# Patient Record
Sex: Female | Born: 1990 | Hispanic: Yes | Marital: Single | Smoking: Former smoker
Health system: Southern US, Community
[De-identification: ages and names within clinical notes are randomized; demographics above are authoritative.]

## PROBLEM LIST (undated history)

## (undated) DIAGNOSIS — R87629 Unspecified abnormal cytological findings in specimens from vagina: Secondary | ICD-10-CM

## (undated) DIAGNOSIS — K029 Dental caries, unspecified: Secondary | ICD-10-CM

## (undated) DIAGNOSIS — F329 Major depressive disorder, single episode, unspecified: Secondary | ICD-10-CM

## (undated) DIAGNOSIS — Z9289 Personal history of other medical treatment: Secondary | ICD-10-CM

## (undated) DIAGNOSIS — R7611 Nonspecific reaction to tuberculin skin test without active tuberculosis: Secondary | ICD-10-CM

## (undated) DIAGNOSIS — O24419 Gestational diabetes mellitus in pregnancy, unspecified control: Secondary | ICD-10-CM

## (undated) DIAGNOSIS — D649 Anemia, unspecified: Secondary | ICD-10-CM

## (undated) DIAGNOSIS — E039 Hypothyroidism, unspecified: Secondary | ICD-10-CM

## (undated) HISTORY — DX: Personal history of other medical treatment: Z92.89

## (undated) HISTORY — DX: Anemia, unspecified: D64.9

## (undated) HISTORY — DX: Dental caries, unspecified: K02.9

## (undated) HISTORY — DX: Unspecified abnormal cytological findings in specimens from vagina: R87.629

## (undated) HISTORY — DX: Hypothyroidism, unspecified: E03.9

---
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
## 2014-01-15 ENCOUNTER — Emergency Department: Payer: Self-pay | Admitting: Emergency Medicine

## 2014-01-15 LAB — URINALYSIS, COMPLETE
Bilirubin,UR: NEGATIVE
Blood: NEGATIVE
Glucose,UR: NEGATIVE mg/dL (ref 0–75)
Leukocyte Esterase: NEGATIVE
Nitrite: NEGATIVE
PH: 6 (ref 4.5–8.0)
Protein: NEGATIVE
RBC,UR: 1 /HPF (ref 0–5)
SPECIFIC GRAVITY: 1.005 (ref 1.003–1.030)
Squamous Epithelial: 1
WBC UR: <1 /HPF (ref 0–5)

## 2014-01-17 ENCOUNTER — Emergency Department: Payer: Self-pay | Admitting: Emergency Medicine

## 2014-01-17 LAB — CBC WITH DIFFERENTIAL/PLATELET
BASOS ABS: 0 10*3/uL (ref 0.0–0.1)
Basophil %: 0.4 %
EOS PCT: 0.1 %
Eosinophil #: 0 10*3/uL (ref 0.0–0.7)
HCT: 34.2 % — ABNORMAL LOW (ref 35.0–47.0)
HGB: 10.8 g/dL — ABNORMAL LOW (ref 12.0–16.0)
Lymphocyte #: 1.3 10*3/uL (ref 1.0–3.6)
Lymphocyte %: 11.1 %
MCH: 23.1 pg — AB (ref 26.0–34.0)
MCHC: 31.6 g/dL — ABNORMAL LOW (ref 32.0–36.0)
MCV: 73 fL — AB (ref 80–100)
Monocyte #: 0.5 x10 3/mm (ref 0.2–0.9)
Monocyte %: 4.3 %
NEUTROS PCT: 84.1 %
Neutrophil #: 9.7 10*3/uL — ABNORMAL HIGH (ref 1.4–6.5)
Platelet: 298 10*3/uL (ref 150–440)
RBC: 4.68 10*6/uL (ref 3.80–5.20)
RDW: 18.6 % — ABNORMAL HIGH (ref 11.5–14.5)
WBC: 11.5 10*3/uL — AB (ref 3.6–11.0)

## 2014-01-17 LAB — COMPREHENSIVE METABOLIC PANEL
ALBUMIN: 3.7 g/dL (ref 3.4–5.0)
ALT: 18 U/L
AST: 15 U/L (ref 15–37)
Alkaline Phosphatase: 72 U/L
Anion Gap: 11 (ref 7–16)
BILIRUBIN TOTAL: 0.3 mg/dL (ref 0.2–1.0)
BUN: 7 mg/dL (ref 7–18)
Calcium, Total: 8.2 mg/dL — ABNORMAL LOW (ref 8.5–10.1)
Chloride: 103 mmol/L (ref 98–107)
Co2: 21 mmol/L (ref 21–32)
Creatinine: 0.62 mg/dL (ref 0.60–1.30)
EGFR (African American): >60
Glucose: 92 mg/dL (ref 65–99)
Osmolality: 268 (ref 275–301)
Potassium: 3.2 mmol/L — ABNORMAL LOW (ref 3.5–5.1)
SODIUM: 135 mmol/L — AB (ref 136–145)
TOTAL PROTEIN: 8.1 g/dL (ref 6.4–8.2)

## 2014-01-17 LAB — LIPASE, BLOOD: LIPASE: 121 U/L (ref 73–393)

## 2014-01-17 LAB — URINALYSIS, COMPLETE
BILIRUBIN, UR: NEGATIVE
Glucose,UR: NEGATIVE mg/dL (ref 0–75)
NITRITE: NEGATIVE
PH: 5 (ref 4.5–8.0)
RBC,UR: 3 /HPF (ref 0–5)
SPECIFIC GRAVITY: 1.024 (ref 1.003–1.030)
Squamous Epithelial: 12
WBC UR: 19 /HPF (ref 0–5)

## 2014-01-17 LAB — HCG, QUANTITATIVE, PREGNANCY: Beta Hcg, Quant.: 43708 m[IU]/mL — ABNORMAL HIGH

## 2014-01-17 LAB — PREGNANCY, URINE: Pregnancy Test, Urine: POSITIVE m[IU]/mL

## 2014-02-12 LAB — OB RESULTS CONSOLE GC/CHLAMYDIA
Chlamydia: NEGATIVE
GC PROBE AMP, GENITAL: NEGATIVE

## 2014-02-12 LAB — OB RESULTS CONSOLE RUBELLA ANTIBODY, IGM
RUBELLA: IMMUNE
Rubella: IMMUNE

## 2014-02-12 LAB — OB RESULTS CONSOLE HEPATITIS B SURFACE ANTIGEN: Hepatitis B Surface Ag: NEGATIVE

## 2014-02-12 LAB — OB RESULTS CONSOLE HGB/HCT, BLOOD
HCT: 15 %
Hemoglobin: 10.6 g/dL

## 2014-02-12 LAB — OB RESULTS CONSOLE VARICELLA ZOSTER ANTIBODY, IGG: VARICELLA IGG: NON-IMMUNE/NOT IMMUNE

## 2014-02-12 LAB — OB RESULTS CONSOLE HIV ANTIBODY (ROUTINE TESTING): HIV: NONREACTIVE

## 2014-02-12 LAB — SICKLE CELL SCREEN: Sickle Cell Screen: NEGATIVE

## 2014-02-12 LAB — OB RESULTS CONSOLE RPR: RPR: NONREACTIVE

## 2014-04-16 ENCOUNTER — Encounter: Payer: Self-pay | Admitting: Maternal & Fetal Medicine

## 2014-06-15 LAB — HM HIV SCREENING LAB: HM HIV Screening: NEGATIVE

## 2014-07-20 ENCOUNTER — Ambulatory Visit
Admit: 2014-07-20 | Disposition: A | Discharge status: Home / Self Care | Payer: Self-pay | Attending: Advanced Practice Midwife | Admitting: Advanced Practice Midwife

## 2014-08-07 ENCOUNTER — Ambulatory Visit
Admit: 2014-08-07 | Disposition: A | Discharge status: Home / Self Care | Payer: Self-pay | Attending: Advanced Practice Midwife | Admitting: Advanced Practice Midwife

## 2014-08-10 LAB — OB RESULTS CONSOLE GC/CHLAMYDIA
CHLAMYDIA, DNA PROBE: NEGATIVE
Gonorrhea: NEGATIVE

## 2014-08-14 LAB — OB RESULTS CONSOLE GBS: GBS: POSITIVE

## 2014-09-04 ENCOUNTER — Observation Stay
Admit: 2014-09-04 | Disposition: A | Discharge status: Home / Self Care | Payer: Self-pay | Attending: Obstetrics and Gynecology | Admitting: Obstetrics and Gynecology

## 2014-09-08 ENCOUNTER — Inpatient Hospital Stay
Admission: EM | Admit: 2014-09-08 | Discharge: 2014-09-08 | Disposition: A | Discharge status: Home / Self Care | Payer: Self-pay | Attending: Obstetrics and Gynecology | Admitting: Obstetrics and Gynecology

## 2014-09-08 ENCOUNTER — Ambulatory Visit
Admission: RE | Admit: 2014-09-08 | Discharge: 2014-09-08 | Disposition: A | Discharge status: Home / Self Care | Payer: Self-pay | Attending: Obstetrics and Gynecology | Admitting: Obstetrics and Gynecology

## 2014-09-08 ENCOUNTER — Encounter: Payer: Self-pay | Admitting: *Deleted

## 2014-09-08 DIAGNOSIS — Z3689 Encounter for other specified antenatal screening: Secondary | ICD-10-CM

## 2014-09-08 DIAGNOSIS — O24419 Gestational diabetes mellitus in pregnancy, unspecified control: Secondary | ICD-10-CM | POA: Insufficient documentation

## 2014-09-08 DIAGNOSIS — Z3A39 39 weeks gestation of pregnancy: Secondary | ICD-10-CM | POA: Insufficient documentation

## 2014-09-08 DIAGNOSIS — Z3A00 Weeks of gestation of pregnancy not specified: Secondary | ICD-10-CM | POA: Insufficient documentation

## 2014-09-08 LAB — POCT NITRAZINE TEST: POCT NITRAZINE (AMNIOSURE): NEGATIVE

## 2014-09-08 NOTE — Discharge Instructions (Signed)
Keep appointment for induction tomorrow at 8:00 PM. Come through ER at [facility]. Come sooner if needed.

## 2014-09-09 ENCOUNTER — Inpatient Hospital Stay
Admission: RE | Admit: 2014-09-09 | Discharge: 2014-09-14 | DRG: 766 | Disposition: A | Discharge status: Home / Self Care | Payer: Medicaid Other | Attending: Obstetrics and Gynecology | Admitting: Obstetrics and Gynecology

## 2014-09-09 DIAGNOSIS — O24429 Gestational diabetes mellitus in childbirth, unspecified control: Secondary | ICD-10-CM | POA: Diagnosis present

## 2014-09-09 DIAGNOSIS — Y763 Surgical instruments, materials and obstetric and gynecological devices (including sutures) associated with adverse incidents: Secondary | ICD-10-CM

## 2014-09-09 DIAGNOSIS — Z915 Personal history of self-harm: Secondary | ICD-10-CM | POA: Diagnosis present

## 2014-09-09 DIAGNOSIS — O24419 Gestational diabetes mellitus in pregnancy, unspecified control: Secondary | ICD-10-CM

## 2014-09-09 DIAGNOSIS — Z79899 Other long term (current) drug therapy: Secondary | ICD-10-CM | POA: Diagnosis not present

## 2014-09-09 DIAGNOSIS — Z8632 Personal history of gestational diabetes: Secondary | ICD-10-CM

## 2014-09-09 DIAGNOSIS — Z98891 History of uterine scar from previous surgery: Secondary | ICD-10-CM

## 2014-09-09 DIAGNOSIS — Z3A40 40 weeks gestation of pregnancy: Secondary | ICD-10-CM | POA: Diagnosis present

## 2014-09-09 DIAGNOSIS — IMO0001 Reserved for inherently not codable concepts without codable children: Secondary | ICD-10-CM

## 2014-09-09 HISTORY — DX: Nonspecific reaction to tuberculin skin test without active tuberculosis: R76.11

## 2014-09-09 HISTORY — DX: Major depressive disorder, single episode, unspecified: F32.9

## 2014-09-09 HISTORY — DX: Gestational diabetes mellitus in pregnancy, unspecified control: O24.419

## 2014-09-09 LAB — CBC
HCT: 34 % — ABNORMAL LOW (ref 35.0–47.0)
Hemoglobin: 10.8 g/dL — ABNORMAL LOW (ref 12.0–16.0)
MCH: 24.4 pg — ABNORMAL LOW (ref 26.0–34.0)
MCHC: 31.9 g/dL — AB (ref 32.0–36.0)
MCV: 76.6 fL — ABNORMAL LOW (ref 80.0–100.0)
Platelets: 251 10*3/uL (ref 150–440)
RBC: 4.43 MIL/uL (ref 3.80–5.20)
RDW: 17.2 % — ABNORMAL HIGH (ref 11.5–14.5)
WBC: 9.9 10*3/uL (ref 3.6–11.0)

## 2014-09-09 LAB — GLUCOSE, CAPILLARY: Glucose-Capillary: 77 mg/dL (ref 70–99)

## 2014-09-09 LAB — TYPE AND SCREEN
ABO/RH(D): O POS
Antibody Screen: NEGATIVE

## 2014-09-09 MED ORDER — CITRIC ACID-SODIUM CITRATE 334-500 MG/5ML PO SOLN: 30.0000 mL | ORAL | Status: DC | PRN

## 2014-09-09 MED ORDER — GLYBURIDE 2.5 MG PO TABS
2.5000 mg | ORAL_TABLET | Freq: Two times a day (BID) | ORAL | Status: DC
  Filled 2014-09-09 (×3): qty 1

## 2014-09-09 MED ORDER — OXYTOCIN 40 UNITS IN LACTATED RINGERS INFUSION - SIMPLE MED
INTRAVENOUS | Status: AC
  Filled 2014-09-09: qty 1000

## 2014-09-09 MED ORDER — OXYTOCIN BOLUS FROM INFUSION
500.0000 mL | INTRAVENOUS | Status: DC
Start: 2014-09-09 — End: 2014-09-10

## 2014-09-09 MED ORDER — AMMONIA AROMATIC IN INHA
RESPIRATORY_TRACT | Status: AC
  Filled 2014-09-09: qty 10

## 2014-09-09 MED ORDER — PENICILLIN G POTASSIUM 5000000 UNITS IJ SOLR
5.0000 10*6.[IU] | Freq: Once | INTRAVENOUS | Status: AC
  Administered 2014-09-09: 5 10*6.[IU] via INTRAVENOUS
  Filled 2014-09-09: qty 5

## 2014-09-09 MED ORDER — LACTATED RINGERS IV SOLN
500.0000 mL | INTRAVENOUS | Status: DC | PRN
Start: 2014-09-09 — End: 2014-09-10
  Administered 2014-09-10: 14:00:00 via INTRAVENOUS
  Administered 2014-09-10 (×2): 500 mL via INTRAVENOUS

## 2014-09-09 MED ORDER — PENICILLIN G POTASSIUM 5000000 UNITS IJ SOLR
2.5000 10*6.[IU] | INTRAVENOUS | Status: DC
  Administered 2014-09-10 (×3): 2.5 10*6.[IU] via INTRAVENOUS
  Filled 2014-09-09 (×11): qty 2.5

## 2014-09-09 MED ORDER — INSULIN ASPART 100 UNIT/ML ~~LOC~~ SOLN
0.0000 [IU] | SUBCUTANEOUS | Status: DC
  Filled 2014-09-09: qty 0.09

## 2014-09-09 MED ORDER — MISOPROSTOL 200 MCG PO TABS
ORAL_TABLET | ORAL | Status: AC
  Filled 2014-09-09: qty 4

## 2014-09-09 MED ORDER — TERBUTALINE SULFATE 1 MG/ML IJ SOLN: 0.2500 mg | Freq: Once | INTRAMUSCULAR | Status: AC | PRN

## 2014-09-09 MED ORDER — OXYCODONE-ACETAMINOPHEN 5-325 MG PO TABS: 2.0000 | ORAL_TABLET | ORAL | Status: DC | PRN

## 2014-09-09 MED ORDER — LIDOCAINE HCL (PF) 1 % IJ SOLN
INTRAMUSCULAR | Status: AC
  Filled 2014-09-09: qty 30

## 2014-09-09 MED ORDER — OXYCODONE-ACETAMINOPHEN 5-325 MG PO TABS: 1.0000 | ORAL_TABLET | ORAL | Status: DC | PRN

## 2014-09-09 MED ORDER — BUTORPHANOL TARTRATE 1 MG/ML IJ SOLN
1.0000 mg | INTRAMUSCULAR | Status: DC | PRN
  Administered 2014-09-10 (×4): 1 mg via INTRAVENOUS

## 2014-09-09 MED ORDER — OXYTOCIN 40 UNITS IN LACTATED RINGERS INFUSION - SIMPLE MED: 62.5000 mL/h | INTRAVENOUS | Status: DC

## 2014-09-09 MED ORDER — ACETAMINOPHEN 325 MG PO TABS: 650.0000 mg | ORAL_TABLET | ORAL | Status: DC | PRN

## 2014-09-09 MED ORDER — LACTATED RINGERS IV SOLN
INTRAVENOUS | Status: DC
  Administered 2014-09-09 – 2014-09-10 (×2): via INTRAVENOUS

## 2014-09-09 MED ORDER — ONDANSETRON HCL 4 MG/2ML IJ SOLN
4.0000 mg | Freq: Four times a day (QID) | INTRAMUSCULAR | Status: DC | PRN
  Administered 2014-09-10: 4 mg via INTRAVENOUS

## 2014-09-09 MED ORDER — DINOPROSTONE 10 MG VA INST
10.0000 mg | VAGINAL_INSERT | Freq: Once | VAGINAL | Status: AC
  Administered 2014-09-09: 10 mg via VAGINAL
  Filled 2014-09-09: qty 1

## 2014-09-09 MED ORDER — LIDOCAINE HCL (PF) 1 % IJ SOLN: 30.0000 mL | INTRAMUSCULAR | Status: DC | PRN

## 2014-09-09 MED ORDER — ZOLPIDEM TARTRATE 5 MG PO TABS: 5.0000 mg | ORAL_TABLET | Freq: Every evening | ORAL | Status: DC | PRN

## 2014-09-09 NOTE — OB Triage Note (Signed)
AVS discharge instructions and labor/pregnancy precautions given to patient with no questions or concerns.   Quest Tavenner F  

## 2014-09-09 NOTE — H&P (Signed)
HISTORY AND PHYSICAL  HISTORY OF PRESENT ILLNESS: Ms. [name] Kidd is a 24 y.o. G1P0 at 7055w6d by 6 week ultrasound with a pregnancy complicated by A2GDM presenting for induction of labor. She has been managed with small doses of glyburide starting at 1.25 mg QHS increasing to 2.5mg  BID in April. She reports fasting blood sugars in the 90s and typical postprandials <120 with the highest recorded value being 137.   She has not been having contractions and denies leakage of fluid, vaginal bleeding, or decreased fetal movement.    REVIEW OF SYSTEMS: A complete review of systems was performed and was specifically negative for headache, changes in vision, RUQ pain, shortness of breath, chest pain, lower extremity edema and dysuria.   HISTORY:  Past Medical History  Diagnosis Date  . Gestational diabetes   . PPD positive, treated 2010    Negative by CXR  . Depression 2006    History of suicide attempt    History reviewed. No pertinent past surgical history.  No current facility-administered medications on file prior to encounter.   Current Outpatient Prescriptions on File Prior to Encounter  Medication Sig Dispense Refill  . Prenatal MV-Min-Fe Fum-FA-DHA (PRENATAL 1 PO) Take 1 tablet by mouth daily.       Allergies  Allergen Reactions  . Condoms - Female [Nonoxynol 9]     OB History  Gravida Para Term Preterm AB SAB TAB Ectopic Multiple Living  1             # Outcome Date GA Lbr Len/2nd Weight Sex Delivery Anes PTL Lv  1 Current               Gynecologic History: History of Abnormal Pap Smear: n/a History of STI: none this pregnancy  History  Substance Use Topics  . Smoking status: Never Smoker   . Smokeless tobacco: Never Used  . Alcohol Use: No    PHYSICAL EXAM: Temp:  [98.1 F (36.7 C)-98.2 F (36.8 C)] 98.1 F (36.7 C) (05/04 2014) Pulse Rate:  [100-102] 100 (05/04 2014) Resp:  [18] 18 (05/03 2200) BP: (120-132)/(77-82) 120/77 mmHg (05/04 2014) Weight:   [86.183 kg (190 lb)] 86.183 kg (190 lb) (05/04 2115)  GENERAL: NAD AAOx3 CHEST:CTAB no increased work of breathing CV:RRR no appreciable murmurs, rubs, gallops ABDOMEN: gravid, nontender, EFW 3800g by Leopolds EXTREMITIES:  Warm and well-perfused, nontender, nonedematous, 2+ DTRs no clonus CERVIX: 1/ 50/ -4 posterior SPECULUM: n/a  FHT:140s baseline with mod variability + accelerations and no decelerations  Toco: quiet  DIAGNOSTIC STUDIES: No results for input(s): WBC, HGB, HCT, PLT, NA, K, CL, CO2, BUN, CREATININE, LABGLOM, GLUCOSE, CALCIUM, BILIDIR, ALKPHOS, AST, ALT, PROT, MG in the last 168 hours.  Invalid input(s): LABALB, UA  PRENATAL STUDIES:  Prenatal Labs:  MBT: O+; Rubella immune, Varicella non- immune, HIV neg, RPR neg, Hep B neg, GC/CT neg, GBS +, glucola 193 3 hr 81/203/162/135  Last US December  ASSESSMENT AND PLAN:  1. Fetal Well being  - Fetal Tracing: reassuing - Group B Streptococcus: +, no PCN allergies.  - Presentation: vtx confirmed by Leopolds    2. Routine OB: - Prenatal labs reviewed, as above - Rh +  3. Induction of Labor:  -  Contractions absent external toco in place -  Pelvis unproven -  Plan for induction with Cervidil  4. Post Partum Planning: - Infant feeding: breast - Contraception: needs to be discussed

## 2014-09-09 NOTE — Progress Notes (Signed)
Spanish Interpreter at Bedside

## 2014-09-10 ENCOUNTER — Inpatient Hospital Stay: Payer: Medicaid Other | Admitting: Anesthesiology

## 2014-09-10 ENCOUNTER — Encounter
Admission: RE | Disposition: A | Discharge status: Home / Self Care | Payer: Self-pay | Source: Home / Self Care | Attending: Obstetrics and Gynecology

## 2014-09-10 ENCOUNTER — Inpatient Hospital Stay: Payer: Medicaid Other

## 2014-09-10 LAB — GLUCOSE, CAPILLARY
GLUCOSE-CAPILLARY: 111 mg/dL — AB (ref 70–99)
GLUCOSE-CAPILLARY: 128 mg/dL — AB (ref 70–99)
Glucose-Capillary: 126 mg/dL — ABNORMAL HIGH (ref 70–99)

## 2014-09-10 LAB — CHLAMYDIA/NGC RT PCR (ARMC ONLY)
CHLAMYDIA TR: NOT DETECTED
N gonorrhoeae: NOT DETECTED

## 2014-09-10 SURGERY — Surgical Case
Anesthesia: Epidural

## 2014-09-10 SURGERY — Surgical Case
Anesthesia: Epidural | Wound class: Clean Contaminated

## 2014-09-10 MED ORDER — CITRIC ACID-SODIUM CITRATE 334-500 MG/5ML PO SOLN
ORAL | Status: AC
  Filled 2014-09-10: qty 15

## 2014-09-10 MED ORDER — OXYTOCIN 40 UNITS IN LACTATED RINGERS INFUSION - SIMPLE MED: 62.5000 mL/h | INTRAVENOUS | Status: DC

## 2014-09-10 MED ORDER — CARBOPROST TROMETHAMINE 250 MCG/ML IM SOLN
INTRAMUSCULAR | Status: AC
  Administered 2014-09-10: 250 ug via INTRAMUSCULAR
  Filled 2014-09-10: qty 1

## 2014-09-10 MED ORDER — METHYLERGONOVINE MALEATE 0.2 MG/ML IJ SOLN
INTRAMUSCULAR | Status: DC | PRN
  Administered 2014-09-10: .2 mg via INTRAMUSCULAR

## 2014-09-10 MED ORDER — MENTHOL 3 MG MT LOZG
1.0000 | LOZENGE | OROMUCOSAL | Status: DC | PRN
  Filled 2014-09-10: qty 9

## 2014-09-10 MED ORDER — OXYCODONE-ACETAMINOPHEN 5-325 MG PO TABS
1.0000 | ORAL_TABLET | ORAL | Status: DC | PRN
  Administered 2014-09-10 – 2014-09-14 (×5): 1 via ORAL
  Filled 2014-09-10 (×4): qty 1

## 2014-09-10 MED ORDER — ONDANSETRON HCL 4 MG/2ML IJ SOLN: 4.0000 mg | Freq: Once | INTRAMUSCULAR | Status: DC | PRN

## 2014-09-10 MED ORDER — ACETAMINOPHEN 325 MG PO TABS: 650.0000 mg | ORAL_TABLET | ORAL | Status: DC | PRN

## 2014-09-10 MED ORDER — PROPOFOL 10 MG/ML IV BOLUS
INTRAVENOUS | Status: DC | PRN
  Administered 2014-09-10: 200 mg via INTRAVENOUS

## 2014-09-10 MED ORDER — HYDROMORPHONE HCL 1 MG/ML IJ SOLN
INTRAMUSCULAR | Status: AC
  Filled 2014-09-10: qty 1

## 2014-09-10 MED ORDER — SIMETHICONE 80 MG PO CHEW
80.0000 mg | CHEWABLE_TABLET | ORAL | Status: DC
  Administered 2014-09-11: 80 mg via ORAL
  Filled 2014-09-10: qty 1

## 2014-09-10 MED ORDER — LANOLIN HYDROUS EX OINT: 1.0000 "application " | TOPICAL_OINTMENT | CUTANEOUS | Status: DC | PRN

## 2014-09-10 MED ORDER — SIMETHICONE 80 MG PO CHEW
80.0000 mg | CHEWABLE_TABLET | ORAL | Status: DC | PRN
  Administered 2014-09-12: 80 mg via ORAL
  Filled 2014-09-10: qty 1

## 2014-09-10 MED ORDER — PRENATAL MULTIVITAMIN CH
1.0000 | ORAL_TABLET | Freq: Every day | ORAL | Status: DC
  Administered 2014-09-11 – 2014-09-14 (×4): 1 via ORAL
  Filled 2014-09-10 (×4): qty 1

## 2014-09-10 MED ORDER — HYDROMORPHONE HCL 1 MG/ML IJ SOLN
0.2500 mg | INTRAMUSCULAR | Status: DC | PRN
  Administered 2014-09-10 (×5): 0.5 mg via INTRAVENOUS

## 2014-09-10 MED ORDER — TERBUTALINE SULFATE 1 MG/ML IJ SOLN
INTRAMUSCULAR | Status: AC
  Filled 2014-09-10: qty 1

## 2014-09-10 MED ORDER — PHENYLEPHRINE HCL 10 MG/ML IJ SOLN
INTRAMUSCULAR | Status: DC | PRN
  Administered 2014-09-10 (×2): 50 ug via INTRAVENOUS

## 2014-09-10 MED ORDER — SIMETHICONE 80 MG PO CHEW
80.0000 mg | CHEWABLE_TABLET | Freq: Three times a day (TID) | ORAL | Status: DC
  Administered 2014-09-12 – 2014-09-14 (×5): 80 mg via ORAL
  Filled 2014-09-10 (×5): qty 1

## 2014-09-10 MED ORDER — TERBUTALINE SULFATE 1 MG/ML IJ SOLN
0.2500 mg | Freq: Once | INTRAMUSCULAR | Status: AC | PRN
  Administered 2014-09-10: 0.25 mg via SUBCUTANEOUS

## 2014-09-10 MED ORDER — LACTATED RINGERS IV SOLN: INTRAVENOUS | Status: DC

## 2014-09-10 MED ORDER — SUCCINYLCHOLINE CHLORIDE 20 MG/ML IJ SOLN
INTRAMUSCULAR | Status: DC | PRN
  Administered 2014-09-10: 140 mg via INTRAVENOUS

## 2014-09-10 MED ORDER — FENTANYL CITRATE (PF) 100 MCG/2ML IJ SOLN
INTRAMUSCULAR | Status: DC | PRN
  Administered 2014-09-10 (×3): 50 ug via INTRAVENOUS
  Administered 2014-09-10: 100 ug via INTRAVENOUS

## 2014-09-10 MED ORDER — FENTANYL 2.5 MCG/ML W/ROPIVACAINE 0.2% IN NS 100 ML EPIDURAL INFUSION (ARMC-ANES)
9.0000 mL/h | EPIDURAL | Status: DC
  Administered 2014-09-10 (×2): 9 mL/h via EPIDURAL

## 2014-09-10 MED ORDER — TETANUS-DIPHTH-ACELL PERTUSSIS 5-2.5-18.5 LF-MCG/0.5 IM SUSP: 0.5000 mL | Freq: Once | INTRAMUSCULAR | Status: DC

## 2014-09-10 MED ORDER — MIDAZOLAM HCL 2 MG/2ML IJ SOLN
INTRAMUSCULAR | Status: DC | PRN
  Administered 2014-09-10: 2 mg via INTRAVENOUS

## 2014-09-10 MED ORDER — FENTANYL 2.5 MCG/ML W/ROPIVACAINE 0.2% IN NS 100 ML EPIDURAL INFUSION (ARMC-ANES)
EPIDURAL | Status: AC
  Filled 2014-09-10: qty 100

## 2014-09-10 MED ORDER — ZOLPIDEM TARTRATE 5 MG PO TABS: 5.0000 mg | ORAL_TABLET | Freq: Every evening | ORAL | Status: DC | PRN

## 2014-09-10 MED ORDER — EPHEDRINE 5 MG/ML INJ
10.0000 mg | INTRAVENOUS | Status: DC | PRN
  Filled 2014-09-10: qty 2

## 2014-09-10 MED ORDER — LIDOCAINE-EPINEPHRINE (PF) 1.5 %-1:200000 IJ SOLN
INTRAMUSCULAR | Status: DC | PRN
  Administered 2014-09-10: 3 mL

## 2014-09-10 MED ORDER — DIPHENHYDRAMINE HCL 50 MG/ML IJ SOLN: 12.5000 mg | INTRAMUSCULAR | Status: DC | PRN

## 2014-09-10 MED ORDER — IBUPROFEN 600 MG PO TABS
600.0000 mg | ORAL_TABLET | Freq: Four times a day (QID) | ORAL | Status: DC
  Administered 2014-09-11 – 2014-09-14 (×12): 600 mg via ORAL
  Filled 2014-09-10 (×12): qty 1

## 2014-09-10 MED ORDER — EPHEDRINE SULFATE 50 MG/ML IJ SOLN
5.0000 mg | Freq: Once | INTRAMUSCULAR | Status: AC
  Administered 2014-09-10: 5 mg via INTRAVENOUS

## 2014-09-10 MED ORDER — BUTORPHANOL TARTRATE 1 MG/ML IJ SOLN
INTRAMUSCULAR | Status: AC
  Filled 2014-09-10: qty 1

## 2014-09-10 MED ORDER — BUPIVACAINE HCL (PF) 0.25 % IJ SOLN
INTRAMUSCULAR | Status: DC | PRN
  Administered 2014-09-10: 8 mL

## 2014-09-10 MED ORDER — CEFAZOLIN SODIUM-DEXTROSE 2-3 GM-% IV SOLR
INTRAVENOUS | Status: AC
Start: 2014-09-10 — End: 2014-09-10
  Administered 2014-09-10: 2 g via INTRAVENOUS
  Filled 2014-09-10: qty 50

## 2014-09-10 MED ORDER — DIPHENHYDRAMINE HCL 25 MG PO CAPS: 25.0000 mg | ORAL_CAPSULE | Freq: Four times a day (QID) | ORAL | Status: DC | PRN

## 2014-09-10 MED ORDER — BUTORPHANOL TARTRATE 1 MG/ML IJ SOLN
INTRAMUSCULAR | Status: AC
  Administered 2014-09-10: 1 mg via INTRAVENOUS
  Filled 2014-09-10: qty 1

## 2014-09-10 MED ORDER — EPHEDRINE SULFATE 50 MG/ML IJ SOLN
INTRAMUSCULAR | Status: AC
  Filled 2014-09-10: qty 1

## 2014-09-10 MED ORDER — SENNOSIDES-DOCUSATE SODIUM 8.6-50 MG PO TABS
2.0000 | ORAL_TABLET | ORAL | Status: DC
  Administered 2014-09-11 – 2014-09-12 (×2): 2 via ORAL
  Filled 2014-09-10 (×2): qty 2

## 2014-09-10 MED ORDER — SODIUM CHLORIDE 0.9 % IJ SOLN
INTRAMUSCULAR | Status: AC
  Filled 2014-09-10: qty 10

## 2014-09-10 MED ORDER — WITCH HAZEL-GLYCERIN EX PADS: 1.0000 "application " | MEDICATED_PAD | CUTANEOUS | Status: DC | PRN

## 2014-09-10 MED ORDER — PHENYLEPHRINE 40 MCG/ML (10ML) SYRINGE FOR IV PUSH (FOR BLOOD PRESSURE SUPPORT)
80.0000 ug | PREFILLED_SYRINGE | INTRAVENOUS | Status: DC | PRN
  Filled 2014-09-10: qty 2

## 2014-09-10 MED ORDER — DIBUCAINE 1 % RE OINT
1.0000 | TOPICAL_OINTMENT | RECTAL | Status: DC | PRN
Start: 2014-09-10 — End: 2014-09-15
  Filled 2014-09-10: qty 28

## 2014-09-10 MED ORDER — OXYTOCIN 40 UNITS IN LACTATED RINGERS INFUSION - SIMPLE MED
1.0000 m[IU]/min | INTRAVENOUS | Status: DC
  Administered 2014-09-10 (×2): 40 mL via INTRAVENOUS

## 2014-09-10 MED ORDER — OXYCODONE-ACETAMINOPHEN 5-325 MG PO TABS
2.0000 | ORAL_TABLET | ORAL | Status: DC | PRN
  Administered 2014-09-11 – 2014-09-13 (×10): 2 via ORAL
  Filled 2014-09-10 (×10): qty 2

## 2014-09-10 SURGICAL SUPPLY — 23 items
BARRIER ADHS 3X4 INTERCEED (GAUZE/BANDAGES/DRESSINGS) ×6 IMPLANT
BNDG TENSOPLAST 6X5 (GAUZE/BANDAGES/DRESSINGS) ×3 IMPLANT
CANISTER SUCT 3000ML (MISCELLANEOUS) ×3 IMPLANT
CATH KIT ON-Q SILVERSOAK 5IN (CATHETERS) ×6 IMPLANT
CHLORAPREP W/TINT 26ML (MISCELLANEOUS) ×3 IMPLANT
DRAPE C-SECTION (MISCELLANEOUS) ×3 IMPLANT
DRSG TELFA 3X8 NADH (GAUZE/BANDAGES/DRESSINGS) ×3 IMPLANT
ELECT CAUTERY BLADE 6.4 (BLADE) ×3 IMPLANT
GAUZE SPONGE 4X4 12PLY STRL (GAUZE/BANDAGES/DRESSINGS) ×3 IMPLANT
GLOVE BIO SURGEON STRL SZ8 (GLOVE) ×3 IMPLANT
GOWN STRL REUS W/ TWL LRG LVL3 (GOWN DISPOSABLE) ×2 IMPLANT
GOWN STRL REUS W/ TWL XL LVL3 (GOWN DISPOSABLE) ×1 IMPLANT
GOWN STRL REUS W/TWL LRG LVL3 (GOWN DISPOSABLE) ×4
GOWN STRL REUS W/TWL XL LVL3 (GOWN DISPOSABLE) ×2
NS IRRIG 1000ML POUR BTL (IV SOLUTION) ×3 IMPLANT
PACK C SECTION AR (MISCELLANEOUS) ×3 IMPLANT
PAD GROUND ADULT SPLIT (MISCELLANEOUS) ×3 IMPLANT
PAD OB MATERNITY 4.3X12.25 (PERSONAL CARE ITEMS) ×3 IMPLANT
PAD PREP 24X41 OB/GYN DISP (PERSONAL CARE ITEMS) ×3 IMPLANT
STRAP SAFETY BODY (MISCELLANEOUS) ×3 IMPLANT
SUT CHROMIC 1 CTX 36 (SUTURE) ×12 IMPLANT
SUT PLAIN GUT 0 (SUTURE) ×6 IMPLANT
SUT VIC AB 0 CT1 36 (SUTURE) ×6 IMPLANT

## 2014-09-10 SURGICAL SUPPLY — 23 items
CANISTER SUCT 3000ML (MISCELLANEOUS) IMPLANT
CATH KIT ON-Q SILVERSOAK 5IN (CATHETERS) IMPLANT
CHLORAPREP W/TINT 26ML (MISCELLANEOUS) IMPLANT
DRSG TELFA 3X8 NADH (GAUZE/BANDAGES/DRESSINGS) IMPLANT
ELECT CAUTERY BLADE 6.4 (BLADE) IMPLANT
GAUZE SPONGE 4X4 12PLY STRL (GAUZE/BANDAGES/DRESSINGS) IMPLANT
GLOVE BIO SURGEON STRL SZ7.5 (GLOVE) IMPLANT
GOWN STRL REUS W/ TWL LRG LVL3 (GOWN DISPOSABLE) IMPLANT
GOWN STRL REUS W/TWL LRG LVL3 (GOWN DISPOSABLE)
NS IRRIG 1000ML POUR BTL (IV SOLUTION) IMPLANT
PACK C SECTION AR (MISCELLANEOUS) IMPLANT
PAD GROUND ADULT SPLIT (MISCELLANEOUS) IMPLANT
PAD OB MATERNITY 4.3X12.25 (PERSONAL CARE ITEMS) IMPLANT
PAD PREP 24X41 OB/GYN DISP (PERSONAL CARE ITEMS) IMPLANT
STRAP SAFETY BODY (MISCELLANEOUS) IMPLANT
SUT MNCRL 0 1X36 CT-1 (SUTURE) IMPLANT
SUT MNCRL 4-0 (SUTURE)
SUT MNCRL 4-0 27XMFL (SUTURE)
SUT MONOCRYL 0 (SUTURE)
SUT VIC AB 0 CT1 36 (SUTURE) IMPLANT
SUT VIC AB 3-0 SH 27 (SUTURE)
SUT VIC AB 3-0 SH 27X BRD (SUTURE) IMPLANT
SUTURE MNCRL 4-0 27XMF (SUTURE) IMPLANT

## 2014-09-10 NOTE — Plan of Care (Deleted)
Cervadil found on exam. Pt. Stated Cervadil came out when up to bathroom early in the morning.

## 2014-09-10 NOTE — Anesthesia Procedure Notes (Addendum)
Epidural Patient location during procedure: OB Start time: 09/10/2014 8:35 AM End time: 09/10/2014 9:26 AM  Staffing Anesthesiologist: Forestine ChutePOLIN, CARRIE Other anesthesia staff: Junious SilkNOLES, MARK Performed by: anesthesiologist and other anesthesia staff   Preanesthetic Checklist Completed: patient identified, surgical consent, pre-op evaluation, timeout performed, IV checked, risks and benefits discussed and monitors and equipment checked  Epidural Patient position: sitting Prep: Betadine Patient monitoring: heart rate, continuous pulse ox and blood pressure Approach: midline Location: L3-L4 Injection technique: LOR saline  Needle:  Needle type: Tuohy  Needle gauge: 18 G Needle insertion depth: 7 cm Catheter at skin depth: 12 cm Test dose: negative and 1.5% lidocaine with Epi 1:200 K  Assessment Sensory level: T10 Events: blood not aspirated, injection not painful and no paresthesia  Additional Notes Junious SilkMark Noles CRNA in room made 3 attempts.  Called MDA Carrie Polin to bedside where she was successful.  No complications.  Reason for block:procedure for pain

## 2014-09-10 NOTE — Transfer of Care (Signed)
Immediate Anesthesia Transfer of Care Note  Patient: [name]  Procedure(s) Performed: * No procedures listed *  Patient Location: PACU and Women's Unit  Anesthesia Type:Epidural  Level of Consciousness: awake, alert  and oriented  Airway & Oxygen Therapy: Patient Spontanous Breathing  Post-op Assessment: Report given to RN  Post vital signs: stable  Last Vitals:  Filed Vitals:   09/10/14 0917  BP: 141/99  Pulse: 111  Temp:   Resp:     Complications: No apparent anesthesia complications

## 2014-09-10 NOTE — Transfer of Care (Signed)
Immediate Anesthesia Transfer of Care Note  Patient: [name] Kidd  Procedure(s) Performed: Procedure(s): CESAREAN SECTION  Patient Location: PACU, Mother/Baby and Women's Unit  Anesthesia Type:General  Level of Consciousness: awake, alert  and oriented  Airway & Oxygen Therapy: Patient Spontanous Breathing and Patient connected to face mask oxygen  Post-op Assessment: Report given to RN, Post -op Vital signs reviewed and stable and Patient moving all extremities X 4  Post vital signs: Reviewed and stable  Last Vitals:  Filed Vitals:   09/10/14 1218  BP: 93/55  Pulse: 124  Temp:   Resp:     Complications: No apparent anesthesia complications

## 2014-09-10 NOTE — Anesthesia Preprocedure Evaluation (Signed)
Anesthesia Evaluation  Patient identified by MRN, date of birth, ID band Patient awake    Reviewed: Allergy & Precautions, H&P , NPO status , Patient's Chart, lab work & pertinent test results  Airway Mallampati: II  TM Distance: >3 FB     Dental no notable dental hx.    Pulmonary neg pulmonary ROS,    Pulmonary exam normal       Cardiovascular negative cardio ROS Normal cardiovascular exam    Neuro/Psych negative neurological ROS  negative psych ROS   GI/Hepatic negative GI ROS, Neg liver ROS,   Endo/Other  negative endocrine ROSdiabetes  Renal/GU negative Renal ROS  negative genitourinary   Musculoskeletal   Abdominal   Peds  Hematology negative hematology ROS (+)   Anesthesia Other Findings   Reproductive/Obstetrics                             Anesthesia Physical  Anesthesia Plan  ASA: II and emergent  Anesthesia Plan: Epidural and General   Post-op Pain Management: MAC Combined w/ Regional for Post-op pain   Induction: Intravenous, Rapid sequence and Cricoid pressure planned  Airway Management Planned: Oral ETT  Additional Equipment:   Intra-op Plan:   Post-operative Plan: Extubation in OR  Informed Consent: I have reviewed the patients History and Physical, chart, labs and discussed the procedure including the risks, benefits and alternatives for the proposed anesthesia with the patient or authorized representative who has indicated his/her understanding and acceptance.     Plan Discussed with: CRNA and Surgeon  Anesthesia Plan Comments: (Pt for stat C/S, plan for general anesthesia if epidural coverage is inadequate)        Anesthesia Quick Evaluation

## 2014-09-10 NOTE — Brief Op Note (Signed)
09/09/2014 - 09/10/2014  1:59 PM  PATIENT:  [name] Kidd  24 y.o. female  PRE-OPERATIVE DIAGNOSIS:  stat csection  POST-OPERATIVE DIAGNOSIS:  stat csection  PROCEDURE:  Procedure(s): CESAREAN SECTION (N/A)  SURGEON:  Surgeon(s) and Role:    Suzy Bouchard* Teodora Baumgarten J Fidelia Cathers, MD - Primary  PHYSICIAN ASSISTANT:   ASSISTANTS: scrub tech Duwaine Maxinashley smith   ANESTHESIA:   epidural and general  EBL:  Total I/O In: 1000 [I.V.:1000] Out: 1450 [Urine:750; Blood:700]  BLOOD ADMINISTERED:none  DRAINS: none    MEDICATIONS USED:  OTHER methergine and hemabate  SPECIMEN:  Source of Specimen:  placenta  DISPOSITION OF SPECIMEN:  PATHOLOGY  COUNTS:  No count given emergent procedure , xray post op cleared  TOURNIQUET:  * No tourniquets in log *  DICTATION: .Other Dictation: Dictation Number verbal  PLAN OF CARE: Admit to inpatient   PATIENT DISPOSITION:  PACU - hemodynamically stable.   Delay start of Pharmacological VTE agent (>24hrs) due to surgical blood loss or risk of bleeding: no

## 2014-09-10 NOTE — Op Note (Signed)
NAMRadene Kidd:  [name] Kidd Kidd, [name] Kidd      ACCOUNT NO.:  [id number]  MEDICAL RECORD NO.:  [id number]  LOCATION:  LDR1                         FACILITY:  [facility]  PHYSICIAN:  Jennell Cornerhomas Holten Spano, MDDATE OF BIRTH:  06-17-1990  DATE OF PROCEDURE: DATE OF DISCHARGE:                              OPERATIVE REPORT   PREOPERATIVE DIAGNOSIS: 1. Fetal bradycardia, unresponsive to intrauterine resuscitative     maneuvers. 2. Forty +[redacted] weeks gestation.  POSTOPERATIVE DIAGNOSIS: 1. Fetal bradycardia, unresponsive to intrauterine resuscitative     maneuvers. 2. Forty +[redacted] weeks gestation.  PROCEDURE PERFORMED:  Emergent primary low transverse cesarean section.  SURGEON:  Jennell Cornerhomas Karelly Dewalt, MD  ANESTHESIA:  Continuous lumbar epidural converted to general endotracheal anesthesia.  INDICATIONS:  A 24 year old gravida 1, para 0, at 2740 +0 weeks estimated gestational age who was laboring the morning of the procedure and was progressing from 8 to complete.  Fetus demonstrated significant bradycardic episode at 1249 and again at 1300 where the fetal heart rate decelerated to 40s and remained there.  Emergency stat primary cesarean section was called at 1302.  DESCRIPTION OF PROCEDURE:  The patient was emergently brought into the operating room.  The patient was prepped and draped emergently, and Allis test was performed and the patient was tolerating the Allis test. Direct Pfannenstiel incision was made.  Sharp dissection was used to identify the fascia.  The fascia was opened in the midline and opened in a transverse fashion.  The superior aspect of the fascia was grasped with Kocher clamps and the recti muscles dissected free.  Entry into the peritoneal cavity was accomplished with blunt traction and the bladder blade was placed.  Direct lower uterine segment incision was made. During this, the patient was uncomfortable and Anesthesiology performed endotracheal intubation.  An extremely wedged fetal  head was identified and, by aid of nursing vaginal hand, the head was elevated and the head was delivered, and body was delivered.  A pale, floppy female was placed onto the abdomen while the cord was doubly clamped and cut, and infant was passed to Neonatology staff who assigned Apgar scores of 1, 4 and 5. The patient was intubated when infant left the operating room.  The placenta was manually delivered.  The uterus was exteriorized and the endometrial cavity was wiped clean with laparotomy tape.  Intravenous Pitocin was administered.  The uterus was atonic; therefore, patient was given 0.2 mg of intramuscular Methergine.  After 5 minutes, the uterus still demonstrated poor tone and 250 mcg of Hemabate was administered intramuscular as well.  The uterine incision was closed with 1 chromic suture in a running locking fashion.  Good hemostasis was noted.  The posterior cul-de-sac was irrigated and suctioned for hemostasis, and the uterus was placed back into the abdominal cavity.  The pericolic gutters were wiped clean with laparotomy tape.  The uterine incision again appeared hemostatic.  Of note, the ovaries and fallopian tubes appeared normal.  Interceed was placed over the uterine incision in a T-shaped fashion and the fascia was then closed with 0 Vicryl suture in a running, nonlocking fashion with good approximation of edges. Subcutaneous tissues were irrigated and bovied for hemostasis, and the skin was reapproximated with staples.  Flat plate x-ray performed  at the end of the case given no sponge, needle and instrument count was performed before.  There was no evidence of intraabdominal needles, instruments or laparotomy sponges.  The patient did receive 2 g of IV Ancef during the case.  Estimated blood loss:  700 mL.  Intraop fluids: 800 mL.  The patient tolerated the procedure well and was taken to the recovery room in good condition.           ______________________________ Jennell Cornerhomas Yola Paradiso, MD     TS/MEDQ  D:  09/10/2014  T:  09/10/2014  Job:  [id number]

## 2014-09-10 NOTE — Progress Notes (Signed)
Patient standing beside bed and c/o intense pain and request pain medicine.  Interpreter request

## 2014-09-10 NOTE — Progress Notes (Signed)
Patient continually moving around and unable to adequately graph contraction pattern. Pt appears to be contracting every 1-2 while monitoring at bedside.

## 2014-09-10 NOTE — Progress Notes (Signed)
Patient ID: [name] Kidd, female   DOB: [birth year], 24 y.o.   MRN: [id number]  Fetal bradycardia at 1300 and didn't respond to in utero rescucitation therefore a stat c/s was called at 1302 . Fetus delivered at 1311. See op note

## 2014-09-10 NOTE — Progress Notes (Signed)
Kathreen DevoidDiana Beatriz Lucianne LeiMorales Valdivia is a 24 y.o. G1P0 at 3968w0d by LMP admitted for active labor  Subjective assumming care for this pt . Active labor . ? LOF since yesterday . + GBS abx on PCN for prophylaxis . Just received a CLE   Objective: BP 141/99 mmHg  Pulse 111  Temp(Src) 98.4 F (36.9 C) (Oral)  Resp 20  Ht 5\' 1"  (1.549 m)  Wt 86.183 kg (190 lb)  BMI 35.92 kg/m2  SpO2 99% I/O last 3 completed shifts: In: 1029.2 [I.V.:929.2; IV Piggyback:100] Out: -     FHT:  FHR: 140 bpm, variability: moderate,  accelerations:  Present,  decelerations:  Absent UC:   regular, every 5 minutes SVE:   Dilation:5-6 Effacement (%):c Station:+1 Exam by:: Blanch Media. Spielman, RN  Labs: Lab Results  Component Value Date   WBC 9.9 09/09/2014   HGB 10.8* 09/09/2014   HCT 34.0* 09/09/2014   MCV 76.6* 09/09/2014   PLT 251 09/09/2014    Assessment / Plan: Spontaneous labor, progressing normally GDM , EFW by leopolds #8.0 Labor: Progressing normally  Fetal Wellbeing:  Category I Pain Control:  Epidural I/D:  n/a Anticipated MOD:  Will start Pitocin for inadequate ctx pattern  Ethridge Sollenberger 09/10/2014, 9:33 AM

## 2014-09-10 NOTE — Anesthesia Preprocedure Evaluation (Addendum)
Anesthesia Evaluation  Patient identified by MRN, date of birth, ID band Patient awake    Reviewed: Allergy & Precautions, H&P , NPO status , Patient's Chart, lab work & pertinent test results  Airway Mallampati: II  TM Distance: >3 FB     Dental no notable dental hx.    Pulmonary neg pulmonary ROS,    Pulmonary exam normal       Cardiovascular negative cardio ROS Normal cardiovascular exam    Neuro/Psych negative neurological ROS  negative psych ROS   GI/Hepatic negative GI ROS, Neg liver ROS,   Endo/Other  negative endocrine ROSdiabetes  Renal/GU negative Renal ROS  negative genitourinary   Musculoskeletal   Abdominal   Peds  Hematology negative hematology ROS (+)   Anesthesia Other Findings   Reproductive/Obstetrics                            Anesthesia Physical Anesthesia Plan  ASA: II  Anesthesia Plan: Epidural   Post-op Pain Management: MAC Combined w/ Regional for Post-op pain   Induction:   Airway Management Planned:   Additional Equipment:   Intra-op Plan:   Post-operative Plan:   Informed Consent: I have reviewed the patients History and Physical, chart, labs and discussed the procedure including the risks, benefits and alternatives for the proposed anesthesia with the patient or authorized representative who has indicated his/her understanding and acceptance.     Plan Discussed with: Anesthesiologist  Anesthesia Plan Comments:         Anesthesia Quick Evaluation

## 2014-09-11 ENCOUNTER — Encounter: Payer: Self-pay | Admitting: Obstetrics and Gynecology

## 2014-09-11 DIAGNOSIS — Z98891 History of uterine scar from previous surgery: Secondary | ICD-10-CM

## 2014-09-11 LAB — CBC
HCT: 29.1 % — ABNORMAL LOW (ref 35.0–47.0)
Hemoglobin: 9.3 g/dL — ABNORMAL LOW (ref 12.0–16.0)
MCH: 24.4 pg — ABNORMAL LOW (ref 26.0–34.0)
MCHC: 31.9 g/dL — AB (ref 32.0–36.0)
MCV: 76.4 fL — ABNORMAL LOW (ref 80.0–100.0)
PLATELETS: 215 10*3/uL (ref 150–440)
RBC: 3.82 MIL/uL (ref 3.80–5.20)
RDW: 16.9 % — AB (ref 11.5–14.5)
WBC: 12.7 10*3/uL — ABNORMAL HIGH (ref 3.6–11.0)

## 2014-09-11 LAB — HIV ANTIBODY (ROUTINE TESTING W REFLEX): HIV Screen 4th Generation wRfx: NONREACTIVE

## 2014-09-11 MED ORDER — LANOLIN HYDROUS EX OINT: 1.0000 "application " | TOPICAL_OINTMENT | CUTANEOUS | Status: DC | PRN

## 2014-09-11 MED ORDER — AFFINITY PRO BREAST PUMP MISC: Status: DC

## 2014-09-11 NOTE — Discharge Instructions (Signed)
Cuidados en el postparto luego de un parto por cesárea  °(Postpartum Care After Cesarean Delivery) °Después del parto (período de postparto), la estadía normal en el hospital es de 24-72 horas. Si hubo problemas con el trabajo de parto o el parto, o si tiene otros problemas médicos, es posible que deba permanecer en el hospital por más tiempo.  °Mientras esté en el hospital, recibirá ayuda e instrucciones sobre cómo cuidar de usted misma y de su bebé recién nacido durante el postparto.  °Mientras esté en el hospital:  °· Es normal que sienta dolor o molestias en la incisión en el abdomen. Asegúrese de decirle a las enfermeras si siente dolor, así como donde siente el dolor y qué empeora el dolor. °· Si está amamantando, puede sentir contracciones dolorosas en el útero durante algunas semanas. Esto es normal. Las contracciones ayudan a que el útero vuelva a su tamaño normal. °· Es normal tener algo de sangrado después del parto. °¨ Durante los primeros 1-3 días después del parto, el flujo es de color rojo y la cantidad puede ser similar a un período. °¨ Es frecuente que el flujo se inicie y se detenga. °¨ En los primeros días, puede eliminar algunos coágulos pequeños. Informe a las enfermeras si comienza a eliminar coágulos grandes o aumenta el flujo. °¨ No  elimine los coágulos de sangre por el inodoro antes de que la enfermera los vea. °¨ Durante los próximos 3 a 10 días después del parto, el flujo debe ser más acuoso y rosado o marrón. °¨ De diez a catorce días después del parto, el flujo debe ser una pequeña cantidad de secreción de color blanco amarillento. °¨ La cantidad de flujo disminuirá en las primeras semanas después del parto. El flujo puede detenerse en 6-8 semanas. La mayoría de las mujeres no tienen más flujo a las 12 semanas después del parto. °· Usted debe cambiar sus apósitos con frecuencia. °· Lávese bien las manos con agua y jabón durante al menos 20 segundos después de cambiar el apósito, usar el  baño o antes de sostener o alimentar a su recién nacido. °· Se le quitará la vía intravenosa (IV) cuando ya esté bebiendo suficientes líquidos. °· El tubo de drenaje para la orina (catéter urinario) que se inserta antes del parto puede ser retirado luego de 6-8 horas después del parto o cuando las piernas vuelvan a tener sensibilidad. Usted puede sentir que tiene que vaciar la vejiga durante las primeras 6-8 horas después de que le quiten el catéter. °· Si se siente débil, mareada o se desmaya, llame a su enfermera antes de levantarse de la cama por primera vez y antes de tomar una ducha por primera vez. °· En los primeros días después del parto, podrá sentir las mamas sensibles y llenas. Esto se llama congestión. La sensibilidad en los senos por lo general desaparece dentro de las 48-72 horas después de que ocurre la congestión. También puede notar que la leche se escapa de sus senos. Si no está amamantando no estimule sus pechos. La estimulación de las mamas hace que sus senos produzcan más leche. °· Pasar tanto tiempo como sea posible con el bebé recién nacido es muy importante. Durante este tiempo, usted y su bebé deben sentirse cerca y conocerse uno al otro. Tener al bebé en su habitación (alojamiento conjunto) ayudará a fortalecer el vínculo con el bebé recién nacido. Esto le dará tiempo para conocerlo y atenderlo de manera cómoda. °· Las hormonas se modifican después del parto. A   veces, los cambios hormonales pueden causar tristeza o ganas de llorar por un tiempo. Estos sentimientos no deben durar más de unos pocos días. Si duran más que eso, debe hablar con su médico. °· Si lo desea, hable con su médico acerca de los métodos de planificación familiar o métodos anticonceptivos. °· Hable con su médico acerca de las vacunas. El médico puede indicarle que se aplique las siguientes vacunas antes de salir del hospital: °¨ Vacuna contra el tétanos, la difteria y la tos ferina (Tdap) o el tétanos y la difteria (Td).  Es muy importante que usted y su familia (incluyendo a los abuelos) u otras personas que cuidan al recién nacido estén al día con las vacunas Tdap o Td. Las vacunas Tdap o Td pueden ayudar a proteger al recién nacido de enfermedades. °¨ Inmunización contra la rubéola. °¨ Inmunización contra la varicela. °¨ Inmunización contra la gripe. Usted debe recibir esta vacunación anual si no la ha recibido durante el embarazo. °Document Released: 04/10/2012 °Exit[employer]® Patient Information ©2015 [employer]. This information is not intended to replace advice given to you by your health care provider. Make sure you discuss any questions you have with your health care provider. ° °

## 2014-09-11 NOTE — Progress Notes (Signed)
Initial Nutrition Assessment  DOCUMENTATION CODES:     INTERVENTION:  Meals and snacks: Cater to pt prefences,  Coordination of care: Spoke with RN, Jacki ConesLaurie and MD not planning to restart checking fingerstick blood glucose at this time, s/p delivery  NUTRITION DIAGNOSIS:  No nutritional diagnosis    GOAL:  Goal would be for pt to meet 90% estimated energy needs  MONITOR:  Meals and snacks:  Glucose profile  REASON FOR ASSESSMENT:  Diagnosis of gestational DM  ASSESSMENT:  Pt admitted s/p emergent C-section PMhx: gestational DM  Spoke with pt via interpreter and pt tolerating regular diet.  Reports eating well during admission and prior to admission.   Labs: Glucose Profile:  Recent Labs  09/10/14 0327 09/10/14 0758 09/10/14 1216  GLUCAP 111* 128* 126*      Height:  Ht Readings from Last 1 Encounters:  09/09/14 5\' 1"  (1.549 m)    Weight:  Wt Readings from Last 1 Encounters:  09/09/14 190 lb (86.183 kg)      Wt Readings from Last 10 Encounters:  09/09/14 190 lb (86.183 kg)    BMI:  Body mass index is 35.92 kg/(m^2).    Skin:  Reviewed, no issues  Diet Order:  Diet regular Room service appropriate?: Yes; Fluid consistency:: Thin  EDUCATION NEEDS:  No education needs identified at this time   Intake/Output Summary (Last 24 hours) at 09/11/14 1628 Last data filed at 09/11/14 1301  Gross per 24 hour  Intake      0 ml  Output   3700 ml  Net  -3700 ml      Low level of care  Shirrell Solinger B. Freida BusmanAllen, RD, LDN [phone] (pager)

## 2014-09-11 NOTE — Discharge Summary (Addendum)
Physician Obstetric Discharge Summary  Patient ID: [name] Kidd MRN: [id number] DOB/AGE: 10-01-1990 24 y.o.   Date of Admission: 09/09/2014  Date of Discharge:   Admitting Diagnosis: Induction of labor at 3334w0d  Secondary Diagnosis: Gestational diabetes medication controlled (A2)  Mode of Delivery: primary cesarean section low uterine, transverse     Discharge Diagnosis: Reasons for cesarean section  Non-reassuring FHR, emergency c-section for terminal bradycardia   Intrapartum Procedures: epidural, pitocin augmentation, placement of fetal scalp electrode and placement of intrauterine catheter   Post partum procedures: none - needs varicella but recommend vaccine given at discharge from transferring hospital  Complications: none   Brief Hospital Course  (Cesarean Section): [name] Kidd is a G1P1001 who underwent medically indicated induction of labor on 09/09/2014, followed by an emergency cesarean section for fetal bradycardia to the 40s on  09/10/2014.  Patient had a surgery complicated by uterine atony, and received uterotonics for total EBL of 700; for further details of this surgery, please refer to the operative note.  The baby's apgars were concerning, and the baby's status required transferring to a tertiary care center for stabilization.   To be with her baby, the patient [facility] on POD#1 after Foley catheter removal. As of POD#1, she had an uncomplicated postpartum course. She was tolerating a regular diet, pain was controlled with po medication, and patient was voiding spontaneous at time of discharge. She was pumping q3 hrs at time of discharge.   Labs: CBC Latest Ref Rng 09/11/2014 09/09/2014 02/12/2014  WBC 3.6 - 11.0 K/uL 12.7(H) 9.9 -  Hemoglobin 12.0 - 16.0 g/dL 4.0(J9.3(L) 10.8(L) 10.6  Hematocrit 35.0 - 47.0 % 29.1(L) 34.0(L) 15  Platelets 150 - 440 K/uL 215 251 -   O POS  Physical exam:  Blood pressure 118/70,  pulse 73, temperature 98.6 F (37 C), temperature source Oral, resp. rate 18, height 5\' 1"  (1.549 m), weight 86.183 kg (190 lb), SpO2 98 %. Patient is currently breast pumping. General: alert and no distress Lochia: appropriate Abdomen: soft, NT Uterine Fundus: firm Incision: healing well, no significant drainage, no dehiscence, no significant erythema - dressing dry, staples intact. Extremities: No evidence of DVT seen on physical exam. No lower extremity edema.  Discharge Instructions: Per After Visit Summary. Activity: Advance as tolerated. Pelvic rest for 6 weeks.  Also refer to After Visit Summary Diet: Regular Medications:   Medication List    STOP taking these medications        glyBURIDE 2.5 MG tablet  Commonly known as:  DIABETA      TAKE these medications        AFFINITY PRO BREAST PUMP Misc  - Any brand breastpump  - Dx: Breastfeeding     lanolin Oint  Apply 1 application topically as needed (for breast care).     PRENATAL 1 PO  Take 1 tablet by mouth daily.       Outpatient follow up:  Follow-up Information    Follow up with SCHERMERHORN,THOMAS, MD. Schedule an appointment as soon as possible for a visit in 2 weeks.   Specialty:  Obstetrics and Gynecology   Why:  For postop check   Contact information:   [address] [place] [address] [phone]       Follow up with Jennell CornerSCHERMERHORN,THOMAS, MD In 3 days.   Specialty:  Obstetrics and Gynecology   Why:  For staple removal, if not done [facility]  Contact information:   [address] [place] [address] [phone]       Follow up with SCHERMERHORN,THOMAS, MD. Schedule an appointment as soon as possible for a visit in 6 weeks.   Specialty:  Obstetrics and Gynecology   Why:  For postpartum visit   Contact information:   [address] [place] [address] [phone]      Postpartum contraception: unknown at time of discharge  Discharged  Condition: stable  [facility]   Newborn Data: Disposition:[facility]  Apgars: APGAR (1 MIN): 1   APGAR (5 MINS):  4 APGAR (10 MINS):  5  Baby Feeding: Bottle and Breast  Christeen DouglasBEASLEY, Neha Waight, MD   ADDENDUM:   09/11/14 at 12:30pm Decision has been made to transfer this mother's infant back to our hospital, as his clinical status has improved drastically and tertiary care is no longer required. Therefore, the above discharge summary is no longer accurate or appropriate. She will be completing her postoperative course with us at [facility].

## 2014-09-11 NOTE — Anesthesia Postprocedure Evaluation (Signed)
  Anesthesia Post-op Note  Patient: [name] ParkinDiana Beatriz Morales Valdivia  Procedure(s) Performed: Procedure(s): CESAREAN SECTION (N/A)  Anesthesia type:Epidural, General  Patient location: PACU  Post pain: Pain level controlled  Post assessment: Post-op Vital signs reviewed, Patient's Cardiovascular Status Stable, Respiratory Function Stable, Patent Airway and No signs of Nausea or vomiting  Post vital signs: Reviewed and stable  Last Vitals:  Filed Vitals:   09/11/14 0350  BP: 108/58  Pulse: 100  Temp: 36.8 C  Resp:     Level of consciousness: awake, alert  and patient cooperative  Complications: No apparent anesthesia complications

## 2014-09-12 NOTE — Progress Notes (Signed)
Post Partum Day 2 - postoperative  Subjective: no complaints, up ad lib, voiding, tolerating PO and + flatus  Objective: Blood pressure 121/64, pulse 79, temperature 98 F (36.7 C), temperature source Oral, resp. rate 18, height 5\' 1"  (1.549 m), weight 86.183 kg (190 lb), SpO2 98 %, unknown if currently breastfeeding.  Physical Exam:  General: alert and cooperative Lochia: appropriate Uterine Fundus: firm Incision: no significant drainage, no dehiscence, no significant erythema DVT Evaluation: No evidence of DVT seen on physical exam.   Recent Labs  09/09/14 2030 09/11/14 0525  HGB 10.8* 9.3*  HCT 34.0* 29.1*    Assessment/Plan: Plan for discharge tomorrow, Breastfeeding and Lactation consult  Baby in SCN, not yet tolerating po feeds, otherwise doing well. Pt is pumping. Lactation consult today.    LOS: 3 days   [name] Kidd 09/12/2014, 9:16 AM

## 2014-09-13 NOTE — Plan of Care (Signed)
Problem: Consults Goal: Birthing Suites Patient Information Press F2 to bring up selections list  Outcome: Not Applicable Date Met:  81/82/99 pt has been transferred from L&D to mother/baby

## 2014-09-13 NOTE — Anesthesia Postprocedure Evaluation (Signed)
  Anesthesia Post-op Note  Patient: [name] Kidd  Procedure(s) Performed: * No procedures listed *  Anesthesia type:No value filed.  Patient location: PACU  Post pain: Pain level controlled  Post assessment: Post-op Vital signs reviewed, Patient's Cardiovascular Status Stable, Respiratory Function Stable, Patent Airway and No signs of Nausea or vomiting  Post vital signs: Reviewed and stable  Last Vitals:  Filed Vitals:   09/13/14 0312  BP:   Pulse:   Temp: 36.7 C  Resp:     Level of consciousness: awake, alert  and patient cooperative  Complications: No apparent anesthesia complications

## 2014-09-13 NOTE — Progress Notes (Signed)
Subjective: Postpartum Day 3: Cesarean Delivery Patient reports incisional pain, tolerating PO, + flatus and + BM. Trouble getting clothes on by self because of limiting pain.  Objective: Vital signs in last 24 hours: Temp:  [97.8 F (36.6 C)-98.7 F (37.1 C)] 98.4 F (36.9 C) (05/08 1614) Pulse Rate:  [97-111] 110 (05/08 1614) Resp:  [18-20] 18 (05/08 1614) BP: (99-105)/(52-61) 103/52 mmHg (05/08 1614) SpO2:  [97 %] 97 % (05/08 1614)  Physical Exam:  General: alert, cooperative and no distress Lochia: appropriate Uterine Fundus: firm Incision: healing well, no significant drainage, no dehiscence, no significant erythema DVT Evaluation: No evidence of DVT seen on physical exam.   Recent Labs  09/11/14 0525  HGB 9.3*  HCT 29.1*    Assessment/Plan: Status post Cesarean section. Doing well postoperatively.  Continue current care. Pain meds PRN. Plan to remove staples tomorrow with likely d/c home tomorrow.  Baby still in SCN needing help to nurse and feed.   Christeen DouglasBEASLEY, [name] Kidd 09/13/2014, 5:13 PM

## 2014-09-14 ENCOUNTER — Encounter: Payer: Self-pay | Admitting: Obstetrics and Gynecology

## 2014-09-14 LAB — SURGICAL PATHOLOGY

## 2014-09-14 MED ORDER — DOCUSATE SODIUM 100 MG PO CAPS: 100.0000 mg | ORAL_CAPSULE | Freq: Two times a day (BID) | ORAL | Status: DC | PRN

## 2014-09-14 MED ORDER — OXYCODONE-ACETAMINOPHEN 5-325 MG PO TABS: 1.0000 | ORAL_TABLET | ORAL | Status: DC | PRN

## 2014-09-14 MED ORDER — NORETHINDRONE 0.35 MG PO TABS: 1.0000 | ORAL_TABLET | Freq: Every day | ORAL | Status: DC

## 2014-09-14 MED ORDER — OXYCODONE-ACETAMINOPHEN 5-325 MG PO TABS
1.0000 | ORAL_TABLET | ORAL | Status: DC | PRN
  Administered 2014-09-14 (×2): 2 via ORAL
  Filled 2014-09-14 (×2): qty 2

## 2014-09-14 NOTE — Discharge Summary (Signed)
Obstetric Discharge Summary Reason for Admission: induction of labor Prenatal Procedures: NST and ultrasound Intrapartum Procedures: cesarean: low cervical, transverse Postpartum Procedures: n/q Complications-Operative and Postpartum: none HEMOGLOBIN  Date Value Ref Range Status  09/11/2014 9.3* 12.0 - 16.0 g/dL Final  81/19/147810/12/2013 29.510.6 g/dL Final   HGB  Date Value Ref Range Status  01/17/2014 10.8* 12.0-16.0 g/dL Final   HCT  Date Value Ref Range Status  09/11/2014 29.1* 35.0 - 47.0 % Final  02/12/2014 15 % Final  01/17/2014 34.2* 35.0-47.0 % Final    Physical Exam:  General: alert, cooperative and no distress Lochia: appropriate Uterine Fundus: firm Incision: healing well, no significant drainage, no dehiscence, no significant erythema DVT Evaluation: No evidence of DVT seen on physical exam.  Discharge Diagnoses: Term Pregnancy-delivered  Discharge Information: Date: 09/14/2014 Activity: pelvic rest Diet: routine Medications: Ibuprofen, Colace, Iron and Percocet Condition: stable Instructions: refer to practice specific booklet Discharge to: home Follow-up Information    Follow up with SCHERMERHORN,THOMAS, MD. Schedule an appointment as soon as possible for a visit in 2 weeks.   Specialty:  Obstetrics and Gynecology   Why:  For postop check   Contact information:   [address] [place] [phone]       Follow up with Jennell CornerSCHERMERHORN,THOMAS, MD In 3 days.   Specialty:  Obstetrics and Gynecology   Why:  For staple removal, if not done [facility]   Contact information:   [address] [place] [address] [phone]       Follow up with SCHERMERHORN,THOMAS, MD. Schedule an appointment as soon as possible for a visit in 6 weeks.   Specialty:  Obstetrics and Gynecology   Why:  For postpartum visit   Contact information:   [address] [place] [address] [phone]       Newborn Data: Live born  female  Birth Weight:   APGAR: 1, 3, 5  Stayed in SCN for feeding issues  [name] Kidd 09/14/2014, 8:55 AM

## 2014-09-14 NOTE — Lactation Note (Signed)
Assisted pt in SCN to latch and position baby at breast, baby latches well with support and shaping of breast, breast filling and baby nursed well once milk massaged and flowing, many swallows heard.  Pt will be d/c'd but will stay in her room to room in with baby tonight, she will breastfeed and pump as needed, pt [facility].

## 2014-09-15 ENCOUNTER — Encounter: Payer: Self-pay | Admitting: Obstetrics and Gynecology

## 2014-09-15 LAB — ABO/RH: ABO/RH(D): O POS

## 2014-09-15 NOTE — Addendum Note (Signed)
Addendum  created 09/15/14 1045 by Forestine Chutearrie Raymir Frommelt, MD   Modules edited: Anesthesia Responsible Staff

## 2015-09-28 LAB — HM PAP SMEAR: HM Pap smear: NEGATIVE

## 2016-09-20 ENCOUNTER — Emergency Department
Admission: EM | Admit: 2016-09-20 | Discharge: 2016-09-20 | Disposition: A | Discharge status: Home / Self Care | Payer: Medicaid Other | Attending: Emergency Medicine | Admitting: Emergency Medicine

## 2016-09-20 ENCOUNTER — Encounter: Payer: Self-pay | Admitting: Emergency Medicine

## 2016-09-20 DIAGNOSIS — N3001 Acute cystitis with hematuria: Secondary | ICD-10-CM | POA: Insufficient documentation

## 2016-09-20 DIAGNOSIS — Z79899 Other long term (current) drug therapy: Secondary | ICD-10-CM | POA: Insufficient documentation

## 2016-09-20 LAB — URINALYSIS, COMPLETE (UACMP) WITH MICROSCOPIC
Bilirubin Urine: NEGATIVE
Glucose, UA: NEGATIVE mg/dL
Ketones, ur: NEGATIVE mg/dL
Leukocytes, UA: NEGATIVE
NITRITE: NEGATIVE
Protein, ur: NEGATIVE mg/dL
SPECIFIC GRAVITY, URINE: 1.002 — AB (ref 1.005–1.030)
pH: 6 (ref 5.0–8.0)

## 2016-09-20 LAB — POCT PREGNANCY, URINE: Preg Test, Ur: NEGATIVE

## 2016-09-20 MED ORDER — SULFAMETHOXAZOLE-TRIMETHOPRIM 800-160 MG PO TABS: 1.0000 | ORAL_TABLET | Freq: Two times a day (BID) | ORAL | 0 refills | Status: DC

## 2016-09-20 MED ORDER — PHENAZOPYRIDINE HCL 200 MG PO TABS: 200.0000 mg | ORAL_TABLET | Freq: Three times a day (TID) | ORAL | 0 refills | Status: DC | PRN

## 2016-09-20 MED ORDER — SULFAMETHOXAZOLE-TRIMETHOPRIM 800-160 MG PO TABS
1.0000 | ORAL_TABLET | Freq: Once | ORAL | Status: AC
  Administered 2016-09-20: 1 via ORAL
  Filled 2016-09-20: qty 1

## 2016-09-20 MED ORDER — PHENAZOPYRIDINE HCL 200 MG PO TABS
200.0000 mg | ORAL_TABLET | Freq: Once | ORAL | Status: AC
  Administered 2016-09-20: 200 mg via ORAL
  Filled 2016-09-20: qty 1

## 2016-09-20 NOTE — ED Triage Notes (Signed)
Pt in with co urinary frequency and burning since yesterday. States has hx of UTI's and states feels the same.

## 2016-09-20 NOTE — ED Provider Notes (Signed)
[facility] Emergency Department Provider Note  ____________________________________________  Time seen: Approximately 10:11 PM  I have reviewed the triage vital signs and the nursing notes.   HISTORY  Chief Complaint Dysuria    HPI [name] Kidd is a 26 y.o. female who presents to the emergency department for evaluation of urinary frequency and dysuria. Symptoms started yesterday. She has had urinary tract infections in the past, with the last one being approximately 3 years ago. She denies fever. She denies abdominal pain, nausea, or vomiting.  Past Medical History:  Diagnosis Date  . Depression 2006   History of suicide attempt  . Gestational diabetes   . PPD positive, treated 2010   Negative by CXR    Patient Active Problem List   Diagnosis Date Noted  . S/P emergency C-section 09/11/2014  . Gestational diabetes mellitus, class A2 09/09/2014    Past Surgical History:  Procedure Laterality Date  . CESAREAN SECTION N/A 09/10/2014   Procedure: CESAREAN SECTION;  Surgeon: Suzy Bouchard, MD;  Location: [facility] ORS;  Service: Obstetrics;  Laterality: N/A;    Prior to Admission medications   Medication Sig Start Date End Date Taking? Authorizing Provider  docusate sodium (COLACE) 100 MG capsule Take 1 capsule (100 mg total) by mouth 2 (two) times daily as needed for mild constipation. 09/14/14   Christeen Douglas, MD  lanolin OINT Apply 1 application topically as needed (for breast care). 09/11/14   Christeen Douglas, MD  Misc. Devices (AFFINITY PRO BREAST PUMP) MISC Any brand breastpump Dx: Breastfeeding 09/11/14   Christeen Douglas, MD  norethindrone (ORTHO MICRONOR) 0.35 MG tablet Take 1 tablet (0.35 mg total) by mouth daily. 09/14/14   Christeen Douglas, MD  oxyCODONE-acetaminophen (PERCOCET/ROXICET) 5-325 MG per tablet Take 1-2 tablets by mouth every 4 (four) hours as needed (for pain scale 4-7). 09/14/14   Christeen Douglas, MD   phenazopyridine (PYRIDIUM) 200 MG tablet Take 1 tablet (200 mg total) by mouth 3 (three) times daily as needed for pain. 09/20/16   Breylen Agyeman, Kasandra Knudsen, FNP  Prenatal Vit-Fe Fumarate-FA (PRENATAL MULTIVITAMIN) TABS tablet Take 1 tablet by mouth daily.    [provider]  sulfamethoxazole-trimethoprim (BACTRIM DS,SEPTRA DS) 800-160 MG tablet Take 1 tablet by mouth 2 (two) times daily. 09/20/16   Chinita Pester, FNP    Allergies Condoms - female [nonoxynol 9]  Family History  Problem Relation Age of Onset  . Cancer Maternal Grandmother     Social History Social History  Substance Use Topics  . Smoking status: Never Smoker  . Smokeless tobacco: Never Used  . Alcohol use No    Review of Systems Constitutional: Negative for fever. Respiratory: Negative for shortness of breath or cough. Gastrointestinal: Negative for abdominal pain; negative for nausea , negative for vomiting. Genitourinary: Positive for dysuria , negative for vaginal discharge. Musculoskeletal: Negative for back pain. Skin: Negative for rash, lesion, wound. ____________________________________________   PHYSICAL EXAM:  VITAL SIGNS: ED Triage Vitals [09/20/16 2151]  Enc Vitals Group     BP 114/83     Pulse Rate 83     Resp 20     Temp 98.5 F (36.9 C)     Temp Source Oral     SpO2 100 %     Weight 175 lb (79.4 kg)     Height 5\' 4"  (1.626 m)     Head Circumference      Peak Flow      Pain Score  Pain Loc      Pain Edu?      Excl. in GC?     Constitutional: Alert and oriented. Well appearing and in no acute distress. Eyes: Conjunctivae are normal. Head: Atraumatic. Nose: No congestion/rhinnorhea. Mouth/Throat: Mucous membranes are moist. Respiratory: Normal respiratory effort.  No retractions. Gastrointestinal: Abdomen is soft and nontender without rebound or guarding. Genitourinary: Pelvic exam: Not indicated Musculoskeletal: No extremity tenderness nor edema.  Neurologic:  Normal  speech and language. No gross focal neurologic deficits are appreciated. Speech is normal. No gait instability. Skin:  Skin is warm, dry and intact. No rash noted. Psychiatric: Mood and affect are normal. Speech and behavior are normal.  ____________________________________________   LABS (all labs ordered are listed, but only abnormal results are displayed)  Labs Reviewed  URINALYSIS, COMPLETE (UACMP) WITH MICROSCOPIC - Abnormal; Notable for the following:       Result Value   Color, Urine COLORLESS (*)    APPearance CLEAR (*)    Specific Gravity, Urine 1.002 (*)    Hgb urine dipstick SMALL (*)    Bacteria, UA RARE (*)    Squamous Epithelial / LPF 0-5 (*)    All other components within normal limits  POC URINE PREG, ED  POCT PREGNANCY, URINE   ____________________________________________  RADIOLOGY  Not indicated ____________________________________________   PROCEDURES  Procedure(s) performed: None  ____________________________________________  26 year old female presenting to the emergency department with dysuria. Symptoms and urinalysis results consistent with acute cystitis. She'll be treated with 3 days of Bactrim and Pyridium. She was instructed to follow-up with her primary care provider if she doesn't feel that her symptoms have resolved in 7-10 days. She was instructed to return to emergency department for symptoms that change or worsen if she is unable schedule an appointment.  INITIAL IMPRESSION / ASSESSMENT AND PLAN / ED COURSE  Pertinent labs & imaging results that were available during my care of the patient were reviewed by me and considered in my medical decision making (see chart for details).  ____________________________________________   FINAL CLINICAL IMPRESSION(S) / ED DIAGNOSES  Final diagnoses:  Acute cystitis with hematuria    Note:  This document was prepared using Dragon voice recognition software and may include unintentional dictation  errors.    Chinita Pesterriplett, Marshea Wisher B, FNP 09/20/16 2349    Myrna BlazerSchaevitz, David Matthew, MD 09/22/16 1535

## 2019-09-15 LAB — OB RESULTS CONSOLE TSH: TSH: 2.09

## 2019-09-19 ENCOUNTER — Other Ambulatory Visit: Payer: Self-pay | Admitting: Physician Assistant

## 2019-09-19 DIAGNOSIS — Z3201 Encounter for pregnancy test, result positive: Secondary | ICD-10-CM

## 2019-09-30 ENCOUNTER — Other Ambulatory Visit: Payer: Self-pay

## 2019-09-30 ENCOUNTER — Other Ambulatory Visit: Payer: Self-pay | Admitting: Physician Assistant

## 2019-09-30 ENCOUNTER — Ambulatory Visit
Admission: RE | Admit: 2019-09-30 | Discharge: 2019-09-30 | Disposition: A | Discharge status: Home / Self Care | Payer: Self-pay | Source: Ambulatory Visit | Attending: Physician Assistant | Admitting: Physician Assistant

## 2019-09-30 DIAGNOSIS — Z3201 Encounter for pregnancy test, result positive: Secondary | ICD-10-CM | POA: Insufficient documentation

## 2019-10-14 LAB — OB RESULTS CONSOLE ABO/RH: RH Type: POSITIVE

## 2019-10-14 LAB — OB RESULTS CONSOLE HIV ANTIBODY (ROUTINE TESTING): HIV: NONREACTIVE

## 2019-10-14 LAB — OB RESULTS CONSOLE HEPATITIS B SURFACE ANTIGEN: Hepatitis B Surface Ag: NEGATIVE

## 2019-10-14 LAB — HEPATITIS C ANTIBODY: HCV Ab: NEGATIVE

## 2019-10-14 LAB — OB RESULTS CONSOLE GC/CHLAMYDIA
Chlamydia: NEGATIVE
Gonorrhea: NEGATIVE

## 2019-10-14 LAB — OB RESULTS CONSOLE PLATELET COUNT: Platelets: 250

## 2019-10-14 LAB — OB RESULTS CONSOLE HGB/HCT, BLOOD: Hemoglobin: 11

## 2019-10-14 LAB — OB RESULTS CONSOLE VARICELLA ZOSTER ANTIBODY, IGG: Varicella: NON-IMMUNE/NOT IMMUNE

## 2019-10-14 LAB — OB RESULTS CONSOLE RPR: RPR: NONREACTIVE

## 2019-10-14 LAB — OB RESULTS CONSOLE RUBELLA ANTIBODY, IGM: Rubella: UNDETERMINED

## 2019-10-14 LAB — OB RESULTS CONSOLE ANTIBODY SCREEN: Antibody Screen: NEGATIVE

## 2019-10-22 NOTE — Progress Notes (Signed)
Record abstracted per [facility] records & 10/21/19 phone interview R. Adah Salvage, RN; Sharlette Dense, RN

## 2019-10-23 ENCOUNTER — Ambulatory Visit: Payer: Self-pay

## 2019-10-29 DIAGNOSIS — O99019 Anemia complicating pregnancy, unspecified trimester: Secondary | ICD-10-CM | POA: Insufficient documentation

## 2019-10-29 DIAGNOSIS — O99810 Abnormal glucose complicating pregnancy: Secondary | ICD-10-CM

## 2019-10-29 DIAGNOSIS — O09892 Supervision of other high risk pregnancies, second trimester: Secondary | ICD-10-CM | POA: Insufficient documentation

## 2019-10-29 DIAGNOSIS — O09899 Supervision of other high risk pregnancies, unspecified trimester: Secondary | ICD-10-CM | POA: Insufficient documentation

## 2019-10-29 DIAGNOSIS — Z2839 Other underimmunization status: Secondary | ICD-10-CM

## 2019-10-29 NOTE — Progress Notes (Signed)
Labs & record abstracted per [facility] record Sharlette Dense, RN

## 2019-10-30 ENCOUNTER — Ambulatory Visit: Payer: Self-pay | Admitting: Physician Assistant

## 2019-10-30 ENCOUNTER — Other Ambulatory Visit: Payer: Self-pay

## 2019-10-30 VITALS — BP 99/65 | HR 84 | Temp 98.0°F | Wt 181.2 lb

## 2019-10-30 DIAGNOSIS — Z1331 Encounter for screening for depression: Secondary | ICD-10-CM | POA: Insufficient documentation

## 2019-10-30 DIAGNOSIS — Z3480 Encounter for supervision of other normal pregnancy, unspecified trimester: Secondary | ICD-10-CM

## 2019-10-30 DIAGNOSIS — Z283 Underimmunization status: Secondary | ICD-10-CM

## 2019-10-30 DIAGNOSIS — O99212 Obesity complicating pregnancy, second trimester: Secondary | ICD-10-CM

## 2019-10-30 DIAGNOSIS — O99019 Anemia complicating pregnancy, unspecified trimester: Secondary | ICD-10-CM

## 2019-10-30 DIAGNOSIS — O99810 Abnormal glucose complicating pregnancy: Secondary | ICD-10-CM

## 2019-10-30 DIAGNOSIS — O99891 Other specified diseases and conditions complicating pregnancy: Secondary | ICD-10-CM

## 2019-10-30 DIAGNOSIS — Z2839 Other underimmunization status: Secondary | ICD-10-CM

## 2019-10-30 DIAGNOSIS — Z98891 History of uterine scar from previous surgery: Secondary | ICD-10-CM

## 2019-10-30 MED ORDER — ASPIRIN 81 MG PO TBEC: 81.0000 mg | DELAYED_RELEASE_TABLET | Freq: Every day | ORAL | 0 refills | Status: DC

## 2019-10-30 NOTE — Progress Notes (Signed)
Pt accepted LCSW business card. Pt declined Covid vaccine.

## 2019-10-30 NOTE — Progress Notes (Signed)
[facility] HEALTH DEPT [facility] [address] [place] [address] [phone]  INITIAL PRENATAL VISIT NOTE  Subjective:  [name] Kidd is a 29 y.o. G2P1001 at [redacted]w[redacted]d being seen today to start prenatal care at the [facility] Department.  She is currently monitored for the following issues for this low-risk pregnancy and has History of gestational diabetes; S/P emergency C-section; Abnormal glucose tolerance test (GTT) during pregnancy, antepartum; Anemia in pregnancy, unspecified trimester; Susceptible to Varicella (non-immune), currently pregnant in second trimester; Rubella non-immune status, antepartum; Supervision of other normal pregnancy, antepartum; Obesity affecting pregnancy in second trimester; and Positive depression screening on their problem list.  Patient reports nausea.  Contractions: Not present. Vag. Bleeding: None.  Movement: Absent. Denies leaking of fluid.   Indications for ASA therapy (per uptodate) One of the following: Previous pregnancy with preeclampsia, especially early onset and with an adverse outcome No Multifetal gestation No Chronic hypertension No Type 1 or 2 diabetes mellitus No Chronic kidney disease No Autoimmune disease (antiphospholipid syndrome, systemic lupus erythematosus) No  Two or more of the following: Nulliparity No Obesity (body mass index >30 kg/m2) Yes Family history of preeclampsia in mother or sister No Age ?35 years No Sociodemographic characteristics (African American race, low socioeconomic level) Yes Personal risk factors (eg, previous pregnancy with low birth weight or small for gestational age infant, previous adverse pregnancy outcome [eg, stillbirth], interval >10 years between pregnancies) No   The following portions of the patient's history were reviewed and updated as appropriate: allergies, current medications, past family history, past medical  history, past social history, past surgical history and problem list. Problem list updated.  Objective:   Vitals:   10/30/19 1406  BP: 99/65  Pulse: 84  Temp: 98 F (36.7 C)  Weight: 181 lb 3.2 oz (82.2 kg)    Fetal Status: Fetal Heart Rate (bpm): 148 Fundal Height: 15 cm Movement: Absent  Presentation: Undeterminable   Physical Exam Vitals and nursing note reviewed.  Constitutional:      General: She is not in acute distress.    Appearance: Normal appearance. She is well-developed. She is obese.  HENT:     Head: Normocephalic and atraumatic.     Right Ear: External ear normal.     Left Ear: External ear normal.     Nose: Nose normal. No congestion or rhinorrhea.     Mouth/Throat:     Lips: Pink.     Mouth: Mucous membranes are moist.     Dentition: Normal dentition. No dental caries.     Pharynx: Oropharynx is clear. Uvula midline.  Eyes:     General: No scleral icterus.    Conjunctiva/sclera: Conjunctivae normal.  Neck:     Thyroid: No thyroid mass or thyromegaly.  Cardiovascular:     Rate and Rhythm: Normal rate.     Pulses: Normal pulses.     Comments: Extremities are warm and well perfused Pulmonary:     Effort: Pulmonary effort is normal.     Breath sounds: Normal breath sounds.  Chest:     Breasts: Breasts are symmetrical.        Right: Normal. No mass, nipple discharge or skin change.        Left: Normal. No mass, nipple discharge or skin change.  Abdominal:     General: Abdomen is flat.     Palpations: Abdomen is soft.     Tenderness: There is no abdominal tenderness.  Comments: Gravid   Genitourinary:    General: Normal vulva.     Exam position: Lithotomy position.     Pubic Area: No rash.      Labia:        Right: No rash.        Left: No rash.      Vagina: Normal. No vaginal discharge.     Cervix: No cervical motion tenderness or friability.     Uterus: Normal. Enlarged (Gravid 16-18 wk). Not tender.      Adnexa: Right adnexa normal and  left adnexa normal.     Rectum: Normal. No external hemorrhoid.  Musculoskeletal:     Right lower leg: No edema.     Left lower leg: No edema.  Lymphadenopathy:     Upper Body:     Right upper body: No axillary adenopathy.     Left upper body: No axillary adenopathy.  Skin:    General: Skin is warm.     Capillary Refill: Capillary refill takes less than 2 seconds.  Neurological:     Mental Status: She is alert.     Assessment and Plan:  Pregnancy: G2P1001 at [redacted]w[redacted]d  1. Abnormal glucose tolerance test (GTT) during pregnancy, antepartum Plan to repeat 3h GTT at 26-28 wk.  2. Anemia in pregnancy, unspecified trimester Treat per s.o.  3. Susceptible to Varicella (non-immune), currently pregnant in second trimester Pt counseled, plan to vaccinate PP.  4. Rubella non-immune status, antepartum Pt counseled; plan to vaccinate PP.  5. Supervision of other normal pregnancy, antepartum Reviewed all prenatal records from prior provider.  6. Obesity affecting pregnancy in second trimester MNT referral, add remaining obesity lab. Start daily low-dose aspirin. - Protein / creatinine ratio, urine  (Spot)  7. Positive depression screening PHQ-9 score = 13; prior hx depression. Refer to [facility] LCSW, monitor.  8. S/P emergency C-section Pt desires TOLAC; delivery plans appt in third trim.  Discussed overview of care and coordination with inpatient delivery practices including [facility], [facility], [facility] and [facility].   Preterm labor symptoms and general obstetric precautions including but not limited to vaginal bleeding, contractions, leaking of fluid and fetal movement were reviewed in detail with the patient.  Please refer to After Visit Summary for other counseling recommendations.   Return in about 4 weeks (around 11/27/2019) for Routine prenatal care.  Future Appointments  Date Time Provider [place]  11/27/2019  1:40 PM [facility] PROVIDER [facility] None    Lora Havens, PA-C

## 2019-10-31 LAB — PROTEIN / CREATININE RATIO, URINE
Creatinine, Urine: 66.1 mg/dL
Protein, Ur: 8.5 mg/dL
Protein/Creat Ratio: 129 mg/g creat (ref 0–200)

## 2019-11-06 ENCOUNTER — Telehealth: Payer: Self-pay | Admitting: Dietician

## 2019-11-27 ENCOUNTER — Ambulatory Visit: Payer: Self-pay

## 2019-11-27 ENCOUNTER — Telehealth: Payer: Self-pay

## 2019-11-27 NOTE — Telephone Encounter (Signed)
TC to patient to reschedule canceled appointment. Unsure why patient canceled and clerical wasn't sure. Patient needs maternity revisit. LM with number to call. Interpreter, M. [place].Burt Knack, RN

## 2019-11-28 NOTE — Telephone Encounter (Signed)
Call to client to reschedule missed [facility] appt. Per client, will not be coming back to agency for [facility] as has transferred her [facility] to [facility]. Jossie Ng, RN

## 2019-12-02 ENCOUNTER — Telehealth: Payer: Self-pay

## 2019-12-02 NOTE — Telephone Encounter (Signed)
Encounter inadvertently opened. Please refer to other signed phone note dated 12/02/2019. Jossie Ng, RN

## 2019-12-02 NOTE — Telephone Encounter (Signed)
[facility] 11/27/2019 in [facility] and call to client to reschedule appt. Per client, has transferred her [facility] to [facility] and has appt in August. Counseled client that [facility] appt overdue and declines [facility] appt. Prior paperwork for work mailed to client today and copy sent for scanning. Client counseled paperwork may now need to be completed by new [facility] provider. Salli Real interpreted during phone call. Jossie Ng, RN

## 2020-02-11 ENCOUNTER — Emergency Department
Admission: EM | Admit: 2020-02-11 | Discharge: 2020-02-11 | DRG: 831 | Disposition: A | Discharge status: Other Acute Inpatient Hospital | Payer: Medicaid Other | Attending: Obstetrics and Gynecology | Admitting: Obstetrics and Gynecology

## 2020-02-11 ENCOUNTER — Other Ambulatory Visit: Payer: Self-pay

## 2020-02-11 ENCOUNTER — Inpatient Hospital Stay: Payer: Medicaid Other

## 2020-02-11 ENCOUNTER — Inpatient Hospital Stay (HOSPITAL_COMMUNITY)
Admission: AD | Admit: 2020-02-11 | Discharge: 2020-02-15 | DRG: 831 | Disposition: A | Discharge status: Home / Self Care | Payer: Medicaid Other | Source: Other Acute Inpatient Hospital | Attending: Obstetrics and Gynecology | Admitting: Obstetrics and Gynecology

## 2020-02-11 ENCOUNTER — Emergency Department: Payer: Medicaid Other

## 2020-02-11 ENCOUNTER — Encounter: Payer: Self-pay | Admitting: Internal Medicine

## 2020-02-11 DIAGNOSIS — O99513 Diseases of the respiratory system complicating pregnancy, third trimester: Secondary | ICD-10-CM | POA: Diagnosis present

## 2020-02-11 DIAGNOSIS — F32A Depression, unspecified: Secondary | ICD-10-CM | POA: Diagnosis present

## 2020-02-11 DIAGNOSIS — O99283 Endocrine, nutritional and metabolic diseases complicating pregnancy, third trimester: Secondary | ICD-10-CM | POA: Diagnosis present

## 2020-02-11 DIAGNOSIS — E669 Obesity, unspecified: Secondary | ICD-10-CM | POA: Diagnosis not present

## 2020-02-11 DIAGNOSIS — H9201 Otalgia, right ear: Secondary | ICD-10-CM | POA: Diagnosis present

## 2020-02-11 DIAGNOSIS — O321XX0 Maternal care for breech presentation, not applicable or unspecified: Secondary | ICD-10-CM | POA: Diagnosis not present

## 2020-02-11 DIAGNOSIS — O99013 Anemia complicating pregnancy, third trimester: Secondary | ICD-10-CM | POA: Diagnosis not present

## 2020-02-11 DIAGNOSIS — O34219 Maternal care for unspecified type scar from previous cesarean delivery: Secondary | ICD-10-CM | POA: Diagnosis not present

## 2020-02-11 DIAGNOSIS — O368130 Decreased fetal movements, third trimester, not applicable or unspecified: Secondary | ICD-10-CM | POA: Diagnosis not present

## 2020-02-11 DIAGNOSIS — Z87891 Personal history of nicotine dependence: Secondary | ICD-10-CM

## 2020-02-11 DIAGNOSIS — Z3A31 31 weeks gestation of pregnancy: Secondary | ICD-10-CM | POA: Diagnosis not present

## 2020-02-11 DIAGNOSIS — R109 Unspecified abdominal pain: Secondary | ICD-10-CM | POA: Diagnosis present

## 2020-02-11 DIAGNOSIS — E039 Hypothyroidism, unspecified: Secondary | ICD-10-CM | POA: Diagnosis present

## 2020-02-11 DIAGNOSIS — Z3A30 30 weeks gestation of pregnancy: Secondary | ICD-10-CM

## 2020-02-11 DIAGNOSIS — O98513 Other viral diseases complicating pregnancy, third trimester: Secondary | ICD-10-CM | POA: Diagnosis present

## 2020-02-11 DIAGNOSIS — J9601 Acute respiratory failure with hypoxia: Secondary | ICD-10-CM | POA: Diagnosis present

## 2020-02-11 DIAGNOSIS — D509 Iron deficiency anemia, unspecified: Secondary | ICD-10-CM | POA: Diagnosis present

## 2020-02-11 DIAGNOSIS — U071 COVID-19: Secondary | ICD-10-CM | POA: Diagnosis not present

## 2020-02-11 DIAGNOSIS — Z3490 Encounter for supervision of normal pregnancy, unspecified, unspecified trimester: Secondary | ICD-10-CM

## 2020-02-11 DIAGNOSIS — O368190 Decreased fetal movements, unspecified trimester, not applicable or unspecified: Secondary | ICD-10-CM

## 2020-02-11 DIAGNOSIS — O24410 Gestational diabetes mellitus in pregnancy, diet controlled: Secondary | ICD-10-CM | POA: Diagnosis not present

## 2020-02-11 DIAGNOSIS — O99213 Obesity complicating pregnancy, third trimester: Secondary | ICD-10-CM | POA: Diagnosis not present

## 2020-02-11 DIAGNOSIS — J1282 Pneumonia due to coronavirus disease 2019: Secondary | ICD-10-CM

## 2020-02-11 DIAGNOSIS — R0602 Shortness of breath: Secondary | ICD-10-CM | POA: Diagnosis present

## 2020-02-11 HISTORY — DX: COVID-19: U07.1

## 2020-02-11 HISTORY — DX: Pneumonia due to coronavirus disease 2019: J12.82

## 2020-02-11 LAB — TYPE AND SCREEN
ABO/RH(D): O POS
Antibody Screen: NEGATIVE

## 2020-02-11 LAB — PROCALCITONIN: Procalcitonin: 0.42 ng/mL

## 2020-02-11 LAB — COMPREHENSIVE METABOLIC PANEL
ALT: 55 U/L — ABNORMAL HIGH (ref 0–44)
AST: 104 U/L — ABNORMAL HIGH (ref 15–41)
Albumin: 2.5 g/dL — ABNORMAL LOW (ref 3.5–5.0)
Alkaline Phosphatase: 90 U/L (ref 38–126)
Anion gap: 13 (ref 5–15)
BUN: <5 mg/dL — ABNORMAL LOW (ref 6–20)
CO2: 12 mmol/L — ABNORMAL LOW (ref 22–32)
Calcium: 8 mg/dL — ABNORMAL LOW (ref 8.9–10.3)
Chloride: 107 mmol/L (ref 98–111)
Creatinine, Ser: 0.52 mg/dL (ref 0.44–1.00)
GFR calc non Af Amer: >60 mL/min (ref >60)
Glucose, Bld: 80 mg/dL (ref 70–99)
Potassium: 3.9 mmol/L (ref 3.5–5.1)
Sodium: 132 mmol/L — ABNORMAL LOW (ref 135–145)
Total Bilirubin: 1 mg/dL (ref 0.3–1.2)
Total Protein: 6.2 g/dL — ABNORMAL LOW (ref 6.5–8.1)

## 2020-02-11 LAB — LACTATE DEHYDROGENASE: LDH: 258 U/L — ABNORMAL HIGH (ref 98–192)

## 2020-02-11 LAB — RESP PANEL BY RT PCR (RSV, FLU A&B, COVID)
Influenza A by PCR: NEGATIVE
Influenza B by PCR: NEGATIVE
Respiratory Syncytial Virus by PCR: NEGATIVE
SARS Coronavirus 2 by RT PCR: POSITIVE — AB

## 2020-02-11 LAB — CBC
HCT: 33.1 % — ABNORMAL LOW (ref 36.0–46.0)
HCT: 36.7 % (ref 36.0–46.0)
Hemoglobin: 11 g/dL — ABNORMAL LOW (ref 12.0–15.0)
Hemoglobin: 12 g/dL (ref 12.0–15.0)
MCH: 27.8 pg (ref 26.0–34.0)
MCH: 27.8 pg (ref 26.0–34.0)
MCHC: 33.2 g/dL (ref 30.0–36.0)
MCHC: 32.7 g/dL (ref 30.0–36.0)
MCV: 83.6 fL (ref 80.0–100.0)
MCV: 85.2 fL (ref 80.0–100.0)
Platelets: 194 10*3/uL (ref 150–400)
Platelets: 216 10*3/uL (ref 150–400)
RBC: 3.96 MIL/uL (ref 3.87–5.11)
RBC: 4.31 MIL/uL (ref 3.87–5.11)
RDW: 15.5 % (ref 11.5–15.5)
RDW: 15.4 % (ref 11.5–15.5)
WBC: 6.7 10*3/uL (ref 4.0–10.5)
WBC: 5.2 10*3/uL (ref 4.0–10.5)
nRBC: 0 % (ref 0.0–0.2)
nRBC: 0 % (ref 0.0–0.2)

## 2020-02-11 LAB — FIBRIN DERIVATIVES D-DIMER (ARMC ONLY): Fibrin derivatives D-dimer (ARMC): 1844.56 ng/mL (FEU) — ABNORMAL HIGH (ref 0.00–499.00)

## 2020-02-11 LAB — FIBRINOGEN: Fibrinogen: 417 mg/dL (ref 210–475)

## 2020-02-11 LAB — HIV ANTIBODY (ROUTINE TESTING W REFLEX): HIV Screen 4th Generation wRfx: NONREACTIVE

## 2020-02-11 LAB — CREATININE, SERUM
Creatinine, Ser: 0.58 mg/dL (ref 0.44–1.00)
GFR calc non Af Amer: >60 mL/min (ref >60)

## 2020-02-11 LAB — C-REACTIVE PROTEIN: CRP: 10.9 mg/dL — ABNORMAL HIGH (ref <1.0)

## 2020-02-11 LAB — FERRITIN: Ferritin: 90 ng/mL (ref 11–307)

## 2020-02-11 LAB — LIPASE, BLOOD: Lipase: 51 U/L (ref 11–51)

## 2020-02-11 LAB — TRIGLYCERIDES: Triglycerides: 205 mg/dL — ABNORMAL HIGH (ref <150)

## 2020-02-11 LAB — TROPONIN I (HIGH SENSITIVITY): Troponin I (High Sensitivity): 4 ng/L (ref <18)

## 2020-02-11 LAB — HEPATITIS B SURFACE ANTIGEN: Hepatitis B Surface Ag: NONREACTIVE

## 2020-02-11 LAB — BRAIN NATRIURETIC PEPTIDE: B Natriuretic Peptide: 22.8 pg/mL (ref 0.0–100.0)

## 2020-02-11 MED ORDER — ALBUTEROL SULFATE HFA 108 (90 BASE) MCG/ACT IN AERS
2.0000 | INHALATION_SPRAY | RESPIRATORY_TRACT | Status: DC | PRN
  Filled 2020-02-11: qty 6.7

## 2020-02-11 MED ORDER — PREDNISONE 20 MG PO TABS
50.0000 mg | ORAL_TABLET | Freq: Every day | ORAL | Status: DC
  Administered 2020-02-14: 50 mg via ORAL
  Filled 2020-02-11 (×2): qty 3

## 2020-02-11 MED ORDER — ASCORBIC ACID 500 MG PO TABS
500.0000 mg | ORAL_TABLET | Freq: Every day | ORAL | Status: DC
  Administered 2020-02-11: 09:00:00 500 mg via ORAL
  Filled 2020-02-11: qty 1

## 2020-02-11 MED ORDER — SODIUM CHLORIDE 0.9 % IV SOLN: 250.0000 mL | INTRAVENOUS | Status: DC | PRN

## 2020-02-11 MED ORDER — SODIUM CHLORIDE 0.9 % IV SOLN
200.0000 mg | Freq: Once | INTRAVENOUS | Status: AC
  Administered 2020-02-11: 200 mg via INTRAVENOUS
  Filled 2020-02-11: qty 40

## 2020-02-11 MED ORDER — DOCUSATE SODIUM 100 MG PO CAPS
100.0000 mg | ORAL_CAPSULE | Freq: Every day | ORAL | Status: DC
  Administered 2020-02-14 – 2020-02-15 (×2): 100 mg via ORAL
  Filled 2020-02-11 (×4): qty 1

## 2020-02-11 MED ORDER — METHYLPREDNISOLONE SODIUM SUCC 125 MG IJ SOLR
0.5000 mg/kg | Freq: Two times a day (BID) | INTRAMUSCULAR | Status: AC
  Administered 2020-02-11 – 2020-02-14 (×6): 41.875 mg via INTRAVENOUS
  Filled 2020-02-11 (×6): qty 2

## 2020-02-11 MED ORDER — IPRATROPIUM BROMIDE HFA 17 MCG/ACT IN AERS
2.0000 | INHALATION_SPRAY | RESPIRATORY_TRACT | Status: DC
  Administered 2020-02-11 (×2): 2 via RESPIRATORY_TRACT
  Filled 2020-02-11 (×2): qty 12.9

## 2020-02-11 MED ORDER — SODIUM CHLORIDE 0.9% FLUSH
3.0000 mL | Freq: Two times a day (BID) | INTRAVENOUS | Status: DC
  Administered 2020-02-12 – 2020-02-15 (×8): 3 mL via INTRAVENOUS

## 2020-02-11 MED ORDER — CALCIUM CARBONATE ANTACID 500 MG PO CHEW: 2.0000 | CHEWABLE_TABLET | ORAL | Status: DC | PRN

## 2020-02-11 MED ORDER — METHYLPREDNISOLONE SODIUM SUCC 40 MG IJ SOLR
40.0000 mg | Freq: Two times a day (BID) | INTRAMUSCULAR | Status: DC
  Administered 2020-02-11: 40 mg via INTRAVENOUS
  Filled 2020-02-11: qty 1

## 2020-02-11 MED ORDER — SODIUM CHLORIDE 0.9% FLUSH: 3.0000 mL | INTRAVENOUS | Status: DC | PRN

## 2020-02-11 MED ORDER — ACETAMINOPHEN 325 MG PO TABS: 650.0000 mg | ORAL_TABLET | Freq: Four times a day (QID) | ORAL | Status: DC | PRN

## 2020-02-11 MED ORDER — SODIUM CHLORIDE 0.9 % IV SOLN
100.0000 mg | Freq: Every day | INTRAVENOUS | Status: AC
  Administered 2020-02-12 – 2020-02-15 (×4): 100 mg via INTRAVENOUS
  Filled 2020-02-11: qty 100
  Filled 2020-02-11 (×3): qty 20

## 2020-02-11 MED ORDER — IOHEXOL 350 MG/ML SOLN
75.0000 mL | Freq: Once | INTRAVENOUS | Status: AC | PRN
  Administered 2020-02-11: 75 mL via INTRAVENOUS

## 2020-02-11 MED ORDER — ACETAMINOPHEN 325 MG PO TABS: 650.0000 mg | ORAL_TABLET | ORAL | Status: DC | PRN

## 2020-02-11 MED ORDER — ZOLPIDEM TARTRATE 5 MG PO TABS: 5.0000 mg | ORAL_TABLET | Freq: Every evening | ORAL | Status: DC | PRN

## 2020-02-11 MED ORDER — PRENATAL MULTIVITAMIN CH
1.0000 | ORAL_TABLET | Freq: Every day | ORAL | Status: DC
  Administered 2020-02-12 – 2020-02-15 (×4): 1 via ORAL
  Filled 2020-02-11 (×4): qty 1

## 2020-02-11 MED ORDER — SODIUM CHLORIDE 0.9 % IV SOLN: 100.0000 mg | Freq: Every day | INTRAVENOUS | Status: DC

## 2020-02-11 MED ORDER — AMOXICILLIN-POT CLAVULANATE 500-125 MG PO TABS
1.0000 | ORAL_TABLET | Freq: Three times a day (TID) | ORAL | Status: DC
  Administered 2020-02-11: 500 mg via ORAL
  Filled 2020-02-11 (×3): qty 1

## 2020-02-11 MED ORDER — PRENATAL PLUS 27-1 MG PO TABS
1.0000 | ORAL_TABLET | Freq: Every day | ORAL | Status: DC
  Administered 2020-02-11: 1 via ORAL
  Filled 2020-02-11: qty 1

## 2020-02-11 MED ORDER — HYDROCOD POLST-CPM POLST ER 10-8 MG/5ML PO SUER: 5.0000 mL | Freq: Two times a day (BID) | ORAL | Status: DC | PRN

## 2020-02-11 MED ORDER — GUAIFENESIN-DM 100-10 MG/5ML PO SYRP
10.0000 mL | ORAL_SOLUTION | ORAL | Status: DC | PRN
  Administered 2020-02-11 – 2020-02-15 (×7): 10 mL via ORAL
  Filled 2020-02-11 (×7): qty 10

## 2020-02-11 MED ORDER — ZINC SULFATE 220 (50 ZN) MG PO CAPS
220.0000 mg | ORAL_CAPSULE | Freq: Every day | ORAL | Status: DC
  Administered 2020-02-11: 09:00:00 220 mg via ORAL
  Filled 2020-02-11: qty 1

## 2020-02-11 MED ORDER — DOCUSATE SODIUM 100 MG PO CAPS
100.0000 mg | ORAL_CAPSULE | Freq: Every day | ORAL | Status: DC
  Administered 2020-02-11: 100 mg via ORAL
  Filled 2020-02-11: qty 1

## 2020-02-11 MED ORDER — ONDANSETRON HCL 4 MG PO TABS: 4.0000 mg | ORAL_TABLET | Freq: Four times a day (QID) | ORAL | Status: DC | PRN

## 2020-02-11 MED ORDER — DM-GUAIFENESIN ER 30-600 MG PO TB12: 1.0000 | ORAL_TABLET | Freq: Two times a day (BID) | ORAL | Status: DC | PRN

## 2020-02-11 MED ORDER — ALBUTEROL SULFATE HFA 108 (90 BASE) MCG/ACT IN AERS
2.0000 | INHALATION_SPRAY | Freq: Four times a day (QID) | RESPIRATORY_TRACT | Status: DC
  Administered 2020-02-11 – 2020-02-12 (×5): 2 via RESPIRATORY_TRACT
  Filled 2020-02-11: qty 6.7

## 2020-02-11 MED ORDER — ENOXAPARIN SODIUM 40 MG/0.4ML ~~LOC~~ SOLN
40.0000 mg | SUBCUTANEOUS | Status: DC
  Administered 2020-02-11 – 2020-02-14 (×4): 40 mg via SUBCUTANEOUS
  Filled 2020-02-11 (×4): qty 0.4

## 2020-02-11 NOTE — Progress Notes (Signed)
Labor & Delivery RN arrived to patient's ED room for NST on [redacted]w[redacted]d baby. External FHR and Toco applied. Patient monitored from approximately 0953 until 1022. NST reactive with no contractions noted, moderate variability, 15x15 accelerations, and baseline of 145.

## 2020-02-11 NOTE — ED Triage Notes (Signed)
Pt is [redacted] weeks pregnant, states does have fetal movement, but states is decreased. Pt with strong cough noted in triage. Pt states symptoms of cough, pain in abd when coughing and right ear pain and vomiting that began on 02/04/2020. Pt was tested for covid but has not received results yet. Pt denies vaginal bleeding. Pt in no resp distress in triage. Assist of stratus interpreter adamaris [phone].

## 2020-02-11 NOTE — ED Notes (Signed)
Dr. Manson Passey at bedside.Stratus interpreter used at this time, #[id number]

## 2020-02-11 NOTE — ED Provider Notes (Addendum)
-----------------------------------------   12:42 PM on 02/11/2020 -----------------------------------------  Patient has been seen by OB, medicine as well as ICU.  They believe the patient given her 30-week pregnancy needs to be in the hospital with high risk pregnancy capabilities.  Also given her persistent tachypnea they recommend a CTA of the chest to rule out PE.  CTA performed with abdominal shielding.  No PE but the patient does have diffuse opacities throughout both lung fields.  I discussed the patient with [facility] where they are unfortunately at capacity and unable to accept the patient.  I also spoke to [facility] a Dr. Augusto Gamble, who is accepted the patient to their service.  We will work with [facility] for transfer.  Patient agreeable to plan.   Minna Antis, MD 02/11/20 1243   EKG viewed and interpreted by myself shows sinus tachycardia 102 bpm with a narrow QRS, normal axis, normal intervals, nonspecific ST changes.   Minna Antis, MD 02/11/20 1244

## 2020-02-11 NOTE — ED Notes (Signed)
Pt states that she has had a cough for a week with pain around her neck and head. Pt states she also has had pain in her stomach more on the right side. Pt denies fevers. Pt states she has had slightly decreased fetal movement in the last little bit.

## 2020-02-11 NOTE — Consult Note (Signed)
[facility] Medical Consultation  Parul Porcelli HEN:[id number] DOB: [birth year] DOA: 02/11/2020 PCP: Gorden Harms, PA-C   Requesting physician: Dr Donavan Foil Date of consultation: 02/11/20 Reason for consultation: Covid pneumonia  HPI:  Pleasant 29 year old female with history of subclinical hypothyroidism, depression, gestational diabetes, and who is [redacted] weeks pregnant came into the hospital with complaints of shortness of breath.  She has been feeling sick with muscle aches, weakness, for the past week, and progressively gotten worse and now she is short of breath.  She has been complaining of difficulties take a deep breath as she is coughing every time she tries to do so.  With cough she is also complaining of upper abdominal pain.  She denies any chest pain, reports a few episodes of nausea, no diarrhea.  Review of Systems:  As per HPI, otherwise 10 point system review negative   Impression/Recommendations  Principal problem COVID-19 pneumonia-chest x-ray on admission showed multifocal pneumonia, she is satting well at rest but dipping below 94% with ambulation.  She is quite tachypneic.  She has been started on remdesivir and steroids at [facility] prior to transfer, continue.  Continue supportive treatment with incentive spirometry, D-dimer, closely monitor.  Monitor inflammatory markers  COVID-19 Labs  Recent Labs    02/11/20 0757  FERRITIN 90  LDH 258*  CRP 10.9*    Lab Results  Component Value Date   SARSCOV2NAA POSITIVE (A) 02/11/2020   Active problems Subclinical hypothyroidism-TSH pending  Pregnancy, 30 weeks-management per OB/GYN team  Nausea-due to Covid, symptomatic management   Our hospitalist team will followup again tomorrow. Please contact me if I can be of assistance in the meanwhile. Thank you for this consultation.   Past Medical History:  Diagnosis Date  . Anemia    2015 pregnancy  . Dental caries   . Depression 2006    History of suicide attempt  . Gestational diabetes    2016-took Glyburide-up to 3.75mg  bid  . History of positive PPD    Per record-completed treatment 05/29/2008  . Hypothyroidism    Hx subclinical hypothyroidism  . Pneumonia due to COVID-19 virus 02/11/2020  . PPD positive, treated 2010   Negative by CXR  . Vaginal Pap smear, abnormal    2015-[facility]   Past Surgical History:  Procedure Laterality Date  . CESAREAN SECTION N/A 09/10/2014   Procedure: CESAREAN SECTION;  Surgeon: Suzy Bouchard, MD;  Location: [facility] ORS;  Service: Obstetrics;  Laterality: N/A;   Social History:  reports that she has quit smoking. She has never used smokeless tobacco. She reports current alcohol use. She reports that she does not use drugs.  Allergies  Allergen Reactions  . Condoms - Female [Nonoxynol 9] Other (See Comments)    Reaction:  Unknown   Family History  Problem Relation Age of Onset  . Cancer Maternal Grandmother   . Hypertension Maternal Grandmother   . Hypertension Mother   . Diabetes Mother   . Cancer Mother   . Hypertension Father   . Diabetes Father   . Heart defect Nephew     Prior to Admission medications   Medication Sig Start Date End Date Taking? Authorizing Provider  acetaminophen (TYLENOL) 500 MG tablet Take 500-1,000 mg by mouth every 6 (six) hours as needed for mild pain or fever.    [provider]  AUGMENTIN 875-125 MG tablet Take 1 tablet by mouth 2 (two) times daily. 02/09/20 02/19/20  [provider]  Dextromethorphan-guaiFENesin 10-100 MG/5ML liquid Take 10 mLs  by mouth every 6 (six) hours as needed for cough. 02/09/20   [provider]   Physical Exam: Blood pressure 106/70, temperature 98.2 F (36.8 C), temperature source Oral, resp. rate (!) 21, height 5\' 4"  (1.626 m), weight 83.9 kg, last menstrual period 07/26/2019, SpO2 97 %, unknown if currently breastfeeding. Vitals:   02/11/20 1600  BP: 106/70  Resp: (!) 21  Temp: 98.2 F  (36.8 C)  SpO2: 97%     General: No distress, in bed, tachypneic  Eyes: No scleral icterus  ENT: Moist mucous membranes  Neck: Normal, supple, no thyromegaly  Cardiovascular: Regular rate and rhythm, no murmurs  Respiratory: Faint bibasilar rhonchi but otherwise clear, no wheezing or crackles  Abdomen: Soft, nontender, bowel sounds positive  Skin: No rashes  Neurologic: Nonfocal  Labs on Admission:  Basic Metabolic Panel: Recent Labs  Lab 02/11/20 0701  NA 132*  K 3.9  CL 107  CO2 12*  GLUCOSE 80  BUN <5*  CREATININE 0.52  CALCIUM 8.0*   Liver Function Tests: Recent Labs  Lab 02/11/20 0701  AST 104*  ALT 55*  ALKPHOS 90  BILITOT 1.0  PROT 6.2*  ALBUMIN 2.5*   Recent Labs  Lab 02/11/20 0757  LIPASE 51   No results for input(s): AMMONIA in the last 168 hours. CBC: Recent Labs  Lab 02/11/20 0701  WBC 6.7  HGB 11.0*  HCT 33.1*  MCV 83.6  PLT 194   Cardiac Enzymes: No results for input(s): CKTOTAL, CKMB, CKMBINDEX, TROPONINI in the last 168 hours. BNP: Invalid input(s): POCBNP CBG: No results for input(s): GLUCAP in the last 168 hours.  Radiological Exams on Admission: CT Angio Chest PE W and/or Wo Contrast  Result Date: 02/11/2020 CLINICAL DATA:  Shortness of breath.  COVID-19 positive. EXAM: CT ANGIOGRAPHY CHEST WITH CONTRAST TECHNIQUE: Multidetector CT imaging of the chest was performed using the standard protocol during bolus administration of intravenous contrast. Multiplanar CT image reconstructions and MIPs were obtained to evaluate the vascular anatomy. CONTRAST:  85mL OMNIPAQUE IOHEXOL 350 MG/ML SOLN COMPARISON:  Chest radiograph February 11, 2020 FINDINGS: Cardiovascular: There is no demonstrable pulmonary embolus. There is no thoracic aortic aneurysm or dissection. Visualized great vessels appear normal. No evident pericardial effusion or pericardial thickening. Mediastinum/Nodes: Visualized thyroid appears normal. No evident adenopathy.  There is a small hiatal hernia. Lungs/Pleura: There is widespread airspace opacity throughout the lungs bilaterally involving virtually all lobes in segments to varying degrees. No cavitary areas evident. No pleural effusions. Upper Abdomen: Visualized upper abdominal structures appear unremarkable. Musculoskeletal: No blastic or lytic bone lesions. No evident chest wall lesions. Review of the MIP images confirms the above findings. IMPRESSION: 1. No demonstrable pulmonary embolus. No thoracic aortic aneurysm or dissection. 2. Widespread airspace opacity consistent with multifocal pneumonia. Appearance consistent with known COVID 19 positive status. 3.  No evident adenopathy. 4.  Small hiatal hernia. Electronically Signed   By: February 13, 2020 III M.D.   On: 02/11/2020 10:49   04/12/2020 OB Limited  Result Date: 02/11/2020 CLINICAL DATA:  Decreased fetal movement EXAM: LIMITED OBSTETRIC ULTRASOUND FINDINGS: Number of Fetuses: 1 Heart Rate:  150 bpm Movement: Yes Presentation: Breech Placental Location: Posterior Previa: No Amniotic Fluid (Subjective):  Within normal limits. AFI: 19.2 cm BPD: 7.2 cm 29 w  0 d MATERNAL FINDINGS: Cervix:  Appears closed.  Length: 3.9 cm. Uterus/Adnexae: No abnormality visualized. IMPRESSION: 1. Single live intrauterine gestation in breech presentation. 2. Active fetal heart tones at 150 bpm. 3. Amniotic fluid  index of 19.2 cm, within normal limits. This exam is performed on an emergent basis and does not comprehensively evaluate fetal size, dating, or anatomy; follow-up complete OB US should be considered if further fetal assessment is warranted. Electronically Signed   By: Duanne Guess D.O.   On: 02/11/2020 08:12   DG Chest Portable 1 View  Result Date: 02/11/2020 CLINICAL DATA:  COVID positive.  Cough. EXAM: PORTABLE CHEST 1 VIEW COMPARISON:  None. FINDINGS: Patchy bilateral pneumonia. Negative for air leak. Low volume chest. Normal heart size and mediastinal contours for  technique. IMPRESSION: Multifocal pneumonia. Electronically Signed   By: Marnee Spring M.D.   On: 02/11/2020 07:43   Anaid Haney [facility]  If 7PM-7AM, please contact night-coverage [email or web address]  02/11/2020, 4:34 PM

## 2020-02-11 NOTE — ED Notes (Signed)
Pt ambulated with pulse ox at this time. Pt sats remained above 97%. Pt did cough tremendously upon ambulating.

## 2020-02-11 NOTE — Consult Note (Signed)
ANTEPARTUM PROGRESS NOTE  [name] Kidd is a 29 y.o. G2P1001 at [redacted]w[redacted]d with [facility] Estimated Date of Delivery: 04/17/20 who is in ER with initial C/o DFM and noted to have COVID pneumonia.   Length of Stay:  0 Days. Admitted 02/11/2020  Pregnancy complications: 1. Obesity 2. Prior CS 2016 at [facility] with Dr Feliberto Gottron, stat LTCS for FHR bradycardia 3. Varicella non-immune 4. Rubella equivocal  Prenatal care initiated at [facility], then transferred to [facility].   - records requested to be faxed.   Subjective: Continues to feel SOB, cough, congestion.   Patient reports good fetal movement now.  She reports no uterine contractions, no bleeding and no loss of fluid per vagina.  Vitals:  BP 105/66   Pulse 95   Temp 99.3 F (37.4 C)   Resp (!) 39   Ht 5\' 4"  (1.626 m)   Wt 83.9 kg   LMP 07/26/2019   SpO2 97%   BMI 31.76 kg/m   Physical Examination: CONSTITUTIONAL: Well-developed, well-nourished female HENT:  Normocephalic, atraumatic NEUROLGIC: Alert and oriented to person, place, and time. PSYCHIATRIC: Normal mood and affect. Normal behavior. Normal judgment and thought content. MUSCULOSKELETAL: Normal range of motion. No edema and no tenderness. 2+ distal pulses. ABDOMEN: Soft, nontender, nondistended, gravid. CERVIX:  deferred  Fetal monitoring:  NST performed and reviewed: Reactive, Cat I tracing 145bpm, moderate variability, + accels, no decels Uterine activity: no UCs noted.   Results for orders placed or performed during the hospital encounter of 02/11/20 (from the past 48 hour(s))  Resp Panel by RT PCR (RSV, Flu A&B, Covid) - Nasopharyngeal Swab     Status: Abnormal   Collection Time: 02/11/20  3:45 AM   Specimen: Nasopharyngeal Swab  Result Value Ref Range   SARS Coronavirus 2 by RT PCR POSITIVE (A) NEGATIVE    Comment: RESULT CALLED TO, READ BACK BY AND VERIFIED WITH:  LISA THOMPSON AT 04/12/20 01/12/20 SDR (NOTE) SARS-CoV-2 target  nucleic acids are DETECTED.  SARS-CoV-2 RNA is generally detectable in upper respiratory specimens  during the acute phase of infection. Positive results are indicative of the presence of the identified virus, but do not rule out bacterial infection or co-infection with other pathogens not detected by the test. Clinical correlation with patient history and other diagnostic information is necessary to determine patient infection status. The expected result is Negative.  Fact Sheet for Patients:  03/13/20  Fact Sheet for Healthcare Providers: https://www.moore.com/  This test is not yet approved or cleared by the https://www.young.biz/ FDA and  has been authorized for detection and/or diagnosis of SARS-CoV-2 by FDA under an Emergency Use Authorization (EUA).  This EUA will remain in effect (meaning this test can be u sed) for the duration of  the COVID-19 declaration under Section 564(b)(1) of the Act, 21 U.S.C. section 360bbb-3(b)(1), unless the authorization is terminated or revoked sooner.      Influenza A by PCR NEGATIVE NEGATIVE   Influenza B by PCR NEGATIVE NEGATIVE    Comment: (NOTE) The Xpert Xpress SARS-CoV-2/FLU/RSV assay is intended as an aid in  the diagnosis of influenza from Nasopharyngeal swab specimens and  should not be used as a sole basis for treatment. Nasal washings and  aspirates are unacceptable for Xpert Xpress SARS-CoV-2/FLU/RSV  testing.  Fact Sheet for Patients: [place]  Fact Sheet for Healthcare Providers: https://www.moore.com/  This test is not yet approved or cleared by the https://www.young.biz/ FDA and  has been authorized for detection  and/or diagnosis of SARS-CoV-2 by  FDA under an Emergency Use Authorization (EUA). This EUA will remain  in effect (meaning this test can be used) for the duration of the  Covid-19 declaration under Section 564(b)(1) of the  Act, 21  U.S.C. section 360bbb-3(b)(1), unless the authorization is  terminated or revoked.    Respiratory Syncytial Virus by PCR NEGATIVE NEGATIVE    Comment: (NOTE) Fact Sheet for Patients: https://www.moore.com/https://www.fda.gov/media/142436/download  Fact Sheet for Healthcare Providers: https://www.young.biz/https://www.fda.gov/media/142435/download  This test is not yet approved or cleared by the [place] FDA and  has been authorized for detection and/or diagnosis of SARS-CoV-2 by  FDA under an Emergency Use Authorization (EUA). This EUA will remain  in effect (meaning this test can be used) for the duration of the  COVID-19 declaration under Section 564(b)(1) of the Act, 21 U.S.C.  section 360bbb-3(b)(1), unless the authorization is terminated or  revoked. Performed at [facility], [address] [place], [place] [address]   CBC     Status: Abnormal   Collection Time: 02/11/20  7:01 AM  Result Value Ref Range   WBC 6.7 4.0 - 10.5 K/uL   RBC 3.96 3.87 - 5.11 MIL/uL   Hemoglobin 11.0 (L) 12.0 - 15.0 g/dL   HCT 57.833.1 (L) 36 - 46 %   MCV 83.6 80.0 - 100.0 fL   MCH 27.8 26.0 - 34.0 pg   MCHC 33.2 30.0 - 36.0 g/dL   RDW 46.915.5 62.911.5 - 52.815.5 %   Platelets 194 150 - 400 K/uL   nRBC 0.0 0.0 - 0.2 %    Comment: Performed at [facility], [address] [place], [place] [address]  Comprehensive metabolic panel     Status: Abnormal   Collection Time: 02/11/20  7:01 AM  Result Value Ref Range   Sodium 132 (L) 135 - 145 mmol/L   Potassium 3.9 3.5 - 5.1 mmol/L    Comment: HEMOLYSIS AT THIS LEVEL MAY AFFECT RESULT   Chloride 107 98 - 111 mmol/L   CO2 12 (L) 22 - 32 mmol/L   Glucose, Bld 80 70 - 99 mg/dL    Comment: Glucose reference range applies only to samples taken after fasting for at least 8 hours.   BUN <5 (L) 6 - 20 mg/dL   Creatinine, Ser 4.010.52 0.44 - 1.00 mg/dL   Calcium 8.0 (L) 8.9 - 10.3 mg/dL   Total Protein 6.2 (L) 6.5 - 8.1 g/dL   Albumin 2.5 (L) 3.5 - 5.0 g/dL   AST 027104 (H) 15 -  41 U/L    Comment: HEMOLYSIS AT THIS LEVEL MAY AFFECT RESULT   ALT 55 (H) 0 - 44 U/L   Alkaline Phosphatase 90 38 - 126 U/L   Total Bilirubin 1.0 0.3 - 1.2 mg/dL    Comment: HEMOLYSIS AT THIS LEVEL MAY AFFECT RESULT   GFR calc non Af Amer >60 >60 mL/min   Anion gap 13 5 - 15    Comment: Performed at [facility], [address] [place], [place] [address]  Fibrin derivatives D-Dimer     Status: Abnormal   Collection Time: 02/11/20  7:01 AM  Result Value Ref Range   Fibrin derivatives D-dimer ([facility]) 1,844.56 (H) 0.00 - 499.00 ng/mL (FEU)    Comment: (NOTE) <> Exclusion of Venous Thromboembolism (VTE) - OUTPATIENT ONLY   (Emergency Department or [place])    0-499 ng/ml (FEU): With a low to intermediate pretest probability  for VTE this test result excludes the diagnosis                      of VTE.   >499 ng/ml (FEU) : VTE not excluded; additional work up for VTE is                      required.  <> Testing on Inpatients and Evaluation of Disseminated Intravascular   Coagulation (DIC) Reference Range:   0-499 ng/ml (FEU) Performed at [facility], [address] [place], [place] [address]   Triglycerides     Status: Abnormal   Collection Time: 02/11/20  7:01 AM  Result Value Ref Range   Triglycerides 205 (H) <150 mg/dL    Comment: Performed at [facility], [address] [place], [place] [address]  Brain natriuretic peptide     Status: None   Collection Time: 02/11/20  7:57 AM  Result Value Ref Range   B Natriuretic Peptide 22.8 0.0 - 100.0 pg/mL    Comment: Performed at [facility], [address] [place], [place] [address]  Ferritin     Status: None   Collection Time: 02/11/20  7:57 AM  Result Value Ref Range   Ferritin 90 11 - 307 ng/mL    Comment: Performed at [facility], [address], [place], [place] [address]  Fibrinogen     Status: None   Collection Time: 02/11/20  7:57 AM  Result Value  Ref Range   Fibrinogen 417 210 - 475 mg/dL    Comment: Performed at [facility], [address] [place], [place] [address]  Lactate dehydrogenase     Status: Abnormal   Collection Time: 02/11/20  7:57 AM  Result Value Ref Range   LDH 258 (H) 98 - 192 U/L    Comment: Performed at [facility], [address] [place], [place] [address]  Procalcitonin     Status: None   Collection Time: 02/11/20  7:57 AM  Result Value Ref Range   Procalcitonin 0.42 ng/mL    Comment:        Interpretation: PCT (Procalcitonin) <= 0.5 ng/mL: Systemic infection (sepsis) is not likely. Local bacterial infection is possible. (NOTE)       Sepsis PCT Algorithm           Lower Respiratory Tract                                      Infection PCT Algorithm    ----------------------------     ----------------------------         PCT < 0.25 ng/mL                PCT < 0.10 ng/mL          Strongly encourage             Strongly discourage   discontinuation of antibiotics    initiation of antibiotics    ----------------------------     -----------------------------       PCT 0.25 - 0.50 ng/mL            PCT 0.10 - 0.25 ng/mL               OR       >80% decrease in PCT            Discourage initiation  of                                            antibiotics      Encourage discontinuation           of antibiotics    ----------------------------     -----------------------------         PCT >= 0.50 ng/mL              PCT 0.26 - 0.50 ng/mL               AND        <80% decrease in PCT             Encourage initiation of                                             antibiotics       Encourage continuation           of antibiotics    ----------------------------     -----------------------------        PCT >= 0.50 ng/mL                  PCT > 0.50 ng/mL               AND         increase in PCT                  Strongly encourage                                      initiation of  antibiotics    Strongly encourage escalation           of antibiotics                                     -----------------------------                                           PCT <= 0.25 ng/mL                                                 OR                                        > 80% decrease in PCT                                      Discontinue / Do not initiate  antibiotics  Performed at [facility], [address] [place], [place] [address]   Troponin I (High Sensitivity)     Status: None   Collection Time: 02/11/20  7:57 AM  Result Value Ref Range   Troponin I (High Sensitivity) 4 <18 ng/L    Comment: (NOTE) Elevated high sensitivity troponin I (hsTnI) values and significant  changes across serial measurements may suggest ACS but many other  chronic and acute conditions are known to elevate hsTnI results.  Refer to the "Links" section for chest pain algorithms and additional  guidance. Performed at [facility], [address] [place], [place] [address]   Lipase, blood     Status: None   Collection Time: 02/11/20  7:57 AM  Result Value Ref Range   Lipase 51 11 - 51 U/L    Comment: Performed at [facility], [address] [place], [place] [address]    CT Angio Chest PE W and/or Wo Contrast  Result Date: 02/11/2020 CLINICAL DATA:  Shortness of breath.  COVID-19 positive. EXAM: CT ANGIOGRAPHY CHEST WITH CONTRAST TECHNIQUE: Multidetector CT imaging of the chest was performed using the standard protocol during bolus administration of intravenous contrast. Multiplanar CT image reconstructions and MIPs were obtained to evaluate the vascular anatomy. CONTRAST:  75mL OMNIPAQUE IOHEXOL 350 MG/ML SOLN COMPARISON:  Chest radiograph February 11, 2020 FINDINGS: Cardiovascular: There is no demonstrable pulmonary embolus. There is no thoracic aortic aneurysm or dissection. Visualized great vessels appear  normal. No evident pericardial effusion or pericardial thickening. Mediastinum/Nodes: Visualized thyroid appears normal. No evident adenopathy. There is a small hiatal hernia. Lungs/Pleura: There is widespread airspace opacity throughout the lungs bilaterally involving virtually all lobes in segments to varying degrees. No cavitary areas evident. No pleural effusions. Upper Abdomen: Visualized upper abdominal structures appear unremarkable. Musculoskeletal: No blastic or lytic bone lesions. No evident chest wall lesions. Review of the MIP images confirms the above findings. IMPRESSION: 1. No demonstrable pulmonary embolus. No thoracic aortic aneurysm or dissection. 2. Widespread airspace opacity consistent with multifocal pneumonia. Appearance consistent with known COVID 19 positive status. 3.  No evident adenopathy. 4.  Small hiatal hernia. Electronically Signed   By: Bretta Bang III M.D.   On: 02/11/2020 10:49   US OB Limited  Result Date: 02/11/2020 CLINICAL DATA:  Decreased fetal movement EXAM: LIMITED OBSTETRIC ULTRASOUND FINDINGS: Number of Fetuses: 1 Heart Rate:  150 bpm Movement: Yes Presentation: Breech Placental Location: Posterior Previa: No Amniotic Fluid (Subjective):  Within normal limits. AFI: 19.2 cm BPD: 7.2 cm 29 w  0 d MATERNAL FINDINGS: Cervix:  Appears closed.  Length: 3.9 cm. Uterus/Adnexae: No abnormality visualized. IMPRESSION: 1. Single live intrauterine gestation in breech presentation. 2. Active fetal heart tones at 150 bpm. 3. Amniotic fluid index of 19.2 cm, within normal limits. This exam is performed on an emergent basis and does not comprehensively evaluate fetal size, dating, or anatomy; follow-up complete OB US should be considered if further fetal assessment is warranted. Electronically Signed   By: Duanne Guess D.O.   On: 02/11/2020 08:12   DG Chest Portable 1 View  Result Date: 02/11/2020 CLINICAL DATA:  COVID positive.  Cough. EXAM: PORTABLE CHEST 1 VIEW  COMPARISON:  None. FINDINGS: Patchy bilateral pneumonia. Negative for air leak. Low volume chest. Normal heart size and mediastinal contours for technique. IMPRESSION: Multifocal pneumonia. Electronically Signed   By: Marnee Spring M.D.   On: 02/11/2020 07:43    Current scheduled medications . amoxicillin-clavulanate  1  tablet Oral TID  . vitamin C  500 mg Oral Daily  . docusate sodium  100 mg Oral Daily  . ipratropium  2 puff Inhalation Q4H  . methylPREDNISolone (SOLU-MEDROL) injection  40 mg Intravenous Q12H  . prenatal vitamin w/FE, FA  1 tablet Oral Q1200  . zinc sulfate  220 mg Oral Daily    I have reviewed the patient's current medications.  ASSESSMENT: Patient Active Problem List   Diagnosis Date Noted  . Pneumonia due to COVID-19 virus 02/11/2020  . Hypothyroidism 02/11/2020  . Microcytic anemia 02/11/2020  . Depression 02/11/2020  . Pregnancy 02/11/2020  . Abdominal pain 02/11/2020  . Ear pain, right 02/11/2020  . Supervision of other normal pregnancy, antepartum 10/30/2019  . Obesity affecting pregnancy in second trimester 10/30/2019  . Positive depression screening 10/30/2019  . Abnormal glucose tolerance test (GTT) during pregnancy, antepartum 10/29/2019  . Anemia in pregnancy, unspecified trimester 10/29/2019  . Susceptible to Varicella (non-immune), currently pregnant in second trimester 10/29/2019  . Rubella non-immune status, antepartum 10/29/2019  . S/P emergency C-section 09/11/2014  . History of gestational diabetes 09/09/2014    PLAN: Per ER team and hospitalist, pt to be transferred to higher level of care due to persistent tachypnea and pneumonia.    Randa Ngo, CNM 02/11/2020  1:09 PM

## 2020-02-11 NOTE — Consult Note (Signed)
Remdesivir - Pharmacy Brief Note   A/P:  Remdesivir 200 mg IVPB once followed by 100 mg IVPB daily x 4 days.   Albina Billet, PharmD, BCPS Clinical Pharmacist 02/11/2020 7:31 AM

## 2020-02-11 NOTE — H&P (Signed)
FACULTY PRACTICE ANTEPARTUM ADMISSION HISTORY AND PHYSICAL NOTE   History of Present Illness: [name] Kidd is a 29 y.o. G2P1001 at [redacted]w[redacted]d admitted for covid-19 pneumonia and tachypnea.   Patient reports the fetal movement as active. Patient reports uterine contraction  activity as none. Patient reports  vaginal bleeding as none. Patient describes fluid per vagina as None. Fetal presentation is breech.  Pt seen at [facility] with 5 day history of SOB.  Pt noted 1 day of fever at home to 100.7.  She denies chills.  Pt notes cough x 5 days.  Pt diagnosed with covid-19 pneumonia on chest CT.  Pt denies any known sick contacts.  Pt did not get the vaccine.  Staff at [facility] were concerned about her tachypnea and high risk OB status and asked for her to be transferred to [facility] for further treatment.  She was receiving care through [facility]  The patient also has had a previous cesarean section in 2016.  Patient Active Problem List   Diagnosis Date Noted  . Pneumonia due to COVID-19 virus 02/11/2020  . Hypothyroidism 02/11/2020  . Microcytic anemia 02/11/2020  . Depression 02/11/2020  . Pregnancy 02/11/2020  . Abdominal pain 02/11/2020  . Ear pain, right 02/11/2020  . [redacted] weeks gestation of pregnancy 02/11/2020  . Supervision of other normal pregnancy, antepartum 10/30/2019  . Obesity affecting pregnancy in second trimester 10/30/2019  . Positive depression screening 10/30/2019  . Abnormal glucose tolerance test (GTT) during pregnancy, antepartum 10/29/2019  . Anemia in pregnancy, unspecified trimester 10/29/2019  . Susceptible to Varicella (non-immune), currently pregnant in second trimester 10/29/2019  . Rubella non-immune status, antepartum 10/29/2019  . S/P emergency C-section 09/11/2014  . History of gestational diabetes 09/09/2014    Past Medical History:  Diagnosis Date  . Anemia    2015 pregnancy  . Dental  caries   . Depression 2006   History of suicide attempt  . Gestational diabetes    2016-took Glyburide-up to 3.75mg  bid  . History of positive PPD    Per record-completed treatment 05/29/2008  . Hypothyroidism    Hx subclinical hypothyroidism  . Pneumonia due to COVID-19 virus 02/11/2020  . PPD positive, treated 2010   Negative by CXR  . Vaginal Pap smear, abnormal    2015-[facility]US    Past Surgical History:  Procedure Laterality Date  . CESAREAN SECTION N/A 09/10/2014   Procedure: CESAREAN SECTION;  Surgeon: Suzy Bouchard, MD;  Location: [facility] ORS;  Service: Obstetrics;  Laterality: N/A;    OB History  Gravida Para Term Preterm AB Living  2 1 1  0 0 1  SAB TAB Ectopic Multiple Live Births  0 0 0 0 1    # Outcome Date GA Lbr Len/2nd Weight Sex Delivery Anes PTL Lv  2 Current           1 Term 09/10/14 [redacted]w[redacted]d   M CS-LTranv EPI, Gen  LIV     Complications: Fetal Intolerance, Fetal bradycardia, delivered, current hospitalization    Social History   Socioeconomic History  . Marital status: Single    Spouse name: Not on file  . Number of children: 1  . Years of education: 55  . Highest education level: Not on file  Occupational History  . Not on file  Tobacco Use  . Smoking status: Former 14  . Smokeless tobacco: Never Used  Substance and Sexual Activity  . Alcohol use: Yes    Comment:  weekend-liquor  . Drug use: No  . Sexual activity: Yes    Birth control/protection: Pill  Other Topics Concern  . Not on file  Social History Narrative  . Not on file   Social Determinants of Health   Financial Resource Strain:   . Difficulty of Paying Living Expenses: Not on file  Food Insecurity: No Food Insecurity  . Worried About Programme researcher, broadcasting/film/video in the Last Year: Never true  . Ran Out of Food in the Last Year: Never true  Transportation Needs: No Transportation Needs  . Lack of Transportation (Medical): No  . Lack of Transportation (Non-Medical): No  Physical  Activity:   . Days of Exercise per Week: Not on file  . Minutes of Exercise per Session: Not on file  Stress:   . Feeling of Stress : Not on file  Social Connections:   . Frequency of Communication with Friends and Family: Not on file  . Frequency of Social Gatherings with Friends and Family: Not on file  . Attends Religious Services: Not on file  . Active Member of Clubs or Organizations: Not on file  . Attends Banker Meetings: Not on file  . Marital Status: Not on file    Family History  Problem Relation Age of Onset  . Cancer Maternal Grandmother   . Hypertension Maternal Grandmother   . Hypertension Mother   . Diabetes Mother   . Cancer Mother   . Hypertension Father   . Diabetes Father   . Heart defect Nephew     Allergies  Allergen Reactions  . Condoms - Female [Nonoxynol 9] Other (See Comments)    Reaction:  Unknown    Medications Prior to Admission  Medication Sig Dispense Refill Last Dose  . acetaminophen (TYLENOL) 500 MG tablet Take 500-1,000 mg by mouth every 6 (six) hours as needed for mild pain or fever.     . AUGMENTIN 875-125 MG tablet Take 1 tablet by mouth 2 (two) times daily.     Marland Kitchen Dextromethorphan-guaiFENesin 10-100 MG/5ML liquid Take 10 mLs by mouth every 6 (six) hours as needed for cough.       Review of Systems - Review of Systems  Constitutional: Positive for fever. Negative for chills.  HENT: Negative.   Eyes: Negative.   Respiratory: Positive for cough and shortness of breath. Negative for hemoptysis.   Cardiovascular: Negative for chest pain and palpitations.  Gastrointestinal: Negative.   Genitourinary: Negative.   Musculoskeletal:       Myalgia the first 2 days  Skin: Negative.   Neurological: Negative.   Endo/Heme/Allergies: Negative.   Psychiatric/Behavioral: Negative.    Vitals:  LMP 07/26/2019   Vitals:   02/11/20 1600  BP: 106/70  Resp: (!) 21  Temp: 98.2 F (36.8 C)  SpO2: 97%    Physical  Examination: CONSTITUTIONAL: Well-developed, well-nourished female in no acute distress.  HENT:  Normocephalic, atraumatic, External right and left ear normal. Oropharynx is clear and moist EYES: Conjunctivae and EOM are normal. Pupils are equal, round, and reactive to light. No scleral icterus.  NECK: Normal range of motion, supple, no masses SKIN: Skin is warm and dry. No rash noted. Not diaphoretic. No erythema. No pallor. NEUROLGIC: Alert and oriented to person, place, and time. Normal reflexes, muscle tone coordination. No cranial nerve deficit noted. PSYCHIATRIC: Normal mood and affect. Normal behavior. Normal judgment and thought content. CARDIOVASCULAR: Normal heart rate noted, regular rhythm RESPIRATORY: Effort normal Breath sounds slightly diminished, persistent cough noted.  Mild tachypnea ABDOMEN: Soft, nontender, nondistended, gravid. MUSCULOSKELETAL: Normal range of motion. No edema and no tenderness. 2+ distal pulses.  Cervix: Not evaluated. and found to be not evaluated/ not evaluated./n/a and fetal presentation is breech. Membranes:intact Fetal Monitoring:Baseline: 130s bpm, Variability: Good {> 6 bpm), Accelerations: Reactive, Decelerations: Variable: mild and sporadic Tocometer: Flat  Labs:  Results for orders placed or performed during the hospital encounter of 02/11/20 (from the past 24 hour(s))  Resp Panel by RT PCR (RSV, Flu A&B, Covid) - Nasopharyngeal Swab   Collection Time: 02/11/20  3:45 AM   Specimen: Nasopharyngeal Swab  Result Value Ref Range   SARS Coronavirus 2 by RT PCR POSITIVE (A) NEGATIVE   Influenza A by PCR NEGATIVE NEGATIVE   Influenza B by PCR NEGATIVE NEGATIVE   Respiratory Syncytial Virus by PCR NEGATIVE NEGATIVE  CBC   Collection Time: 02/11/20  7:01 AM  Result Value Ref Range   WBC 6.7 4.0 - 10.5 K/uL   RBC 3.96 3.87 - 5.11 MIL/uL   Hemoglobin 11.0 (L) 12.0 - 15.0 g/dL   HCT 19.133.1 (L) 36 - 46 %   MCV 83.6 80.0 - 100.0 fL   MCH 27.8 26.0 -  34.0 pg   MCHC 33.2 30.0 - 36.0 g/dL   RDW 47.815.5 29.511.5 - 62.115.5 %   Platelets 194 150 - 400 K/uL   nRBC 0.0 0.0 - 0.2 %  Comprehensive metabolic panel   Collection Time: 02/11/20  7:01 AM  Result Value Ref Range   Sodium 132 (L) 135 - 145 mmol/L   Potassium 3.9 3.5 - 5.1 mmol/L   Chloride 107 98 - 111 mmol/L   CO2 12 (L) 22 - 32 mmol/L   Glucose, Bld 80 70 - 99 mg/dL   BUN <5 (L) 6 - 20 mg/dL   Creatinine, Ser 3.080.52 0.44 - 1.00 mg/dL   Calcium 8.0 (L) 8.9 - 10.3 mg/dL   Total Protein 6.2 (L) 6.5 - 8.1 g/dL   Albumin 2.5 (L) 3.5 - 5.0 g/dL   AST 657104 (H) 15 - 41 U/L   ALT 55 (H) 0 - 44 U/L   Alkaline Phosphatase 90 38 - 126 U/L   Total Bilirubin 1.0 0.3 - 1.2 mg/dL   GFR calc non Af Amer >60 >60 mL/min   Anion gap 13 5 - 15  Fibrin derivatives D-Dimer   Collection Time: 02/11/20  7:01 AM  Result Value Ref Range   Fibrin derivatives D-dimer ([facility]) 1,844.56 (H) 0.00 - 499.00 ng/mL (FEU)  Triglycerides   Collection Time: 02/11/20  7:01 AM  Result Value Ref Range   Triglycerides 205 (H) <150 mg/dL  HIV Antibody (routine testing w rflx)   Collection Time: 02/11/20  7:57 AM  Result Value Ref Range   HIV Screen 4th Generation wRfx Non Reactive Non Reactive  Brain natriuretic peptide   Collection Time: 02/11/20  7:57 AM  Result Value Ref Range   B Natriuretic Peptide 22.8 0.0 - 100.0 pg/mL  C-reactive protein   Collection Time: 02/11/20  7:57 AM  Result Value Ref Range   CRP 10.9 (H) <1.0 mg/dL  Ferritin   Collection Time: 02/11/20  7:57 AM  Result Value Ref Range   Ferritin 90 11 - 307 ng/mL  Fibrinogen   Collection Time: 02/11/20  7:57 AM  Result Value Ref Range   Fibrinogen 417 210 - 475 mg/dL  Hepatitis B surface antigen   Collection Time: 02/11/20  7:57 AM  Result Value Ref Range  Hepatitis B Surface Ag NON REACTIVE NON REACTIVE  Lactate dehydrogenase   Collection Time: 02/11/20  7:57 AM  Result Value Ref Range   LDH 258 (H) 98 - 192 U/L  Procalcitonin   Collection  Time: 02/11/20  7:57 AM  Result Value Ref Range   Procalcitonin 0.42 ng/mL  Lipase, blood   Collection Time: 02/11/20  7:57 AM  Result Value Ref Range   Lipase 51 11 - 51 U/L  Troponin I (High Sensitivity)   Collection Time: 02/11/20  7:57 AM  Result Value Ref Range   Troponin I (High Sensitivity) 4 <18 ng/L    Imaging Studies: CT Angio Chest PE W and/or Wo Contrast  Result Date: 02/11/2020 CLINICAL DATA:  Shortness of breath.  COVID-19 positive. EXAM: CT ANGIOGRAPHY CHEST WITH CONTRAST TECHNIQUE: Multidetector CT imaging of the chest was performed using the standard protocol during bolus administration of intravenous contrast. Multiplanar CT image reconstructions and MIPs were obtained to evaluate the vascular anatomy. CONTRAST:  47mL OMNIPAQUE IOHEXOL 350 MG/ML SOLN COMPARISON:  Chest radiograph February 11, 2020 FINDINGS: Cardiovascular: There is no demonstrable pulmonary embolus. There is no thoracic aortic aneurysm or dissection. Visualized great vessels appear normal. No evident pericardial effusion or pericardial thickening. Mediastinum/Nodes: Visualized thyroid appears normal. No evident adenopathy. There is a small hiatal hernia. Lungs/Pleura: There is widespread airspace opacity throughout the lungs bilaterally involving virtually all lobes in segments to varying degrees. No cavitary areas evident. No pleural effusions. Upper Abdomen: Visualized upper abdominal structures appear unremarkable. Musculoskeletal: No blastic or lytic bone lesions. No evident chest wall lesions. Review of the MIP images confirms the above findings. IMPRESSION: 1. No demonstrable pulmonary embolus. No thoracic aortic aneurysm or dissection. 2. Widespread airspace opacity consistent with multifocal pneumonia. Appearance consistent with known COVID 19 positive status. 3.  No evident adenopathy. 4.  Small hiatal hernia. Electronically Signed   By: Bretta Bang III M.D.   On: 02/11/2020 10:49   US OB  Limited  Result Date: 02/11/2020 CLINICAL DATA:  Decreased fetal movement EXAM: LIMITED OBSTETRIC ULTRASOUND FINDINGS: Number of Fetuses: 1 Heart Rate:  150 bpm Movement: Yes Presentation: Breech Placental Location: Posterior Previa: No Amniotic Fluid (Subjective):  Within normal limits. AFI: 19.2 cm BPD: 7.2 cm 29 w  0 d MATERNAL FINDINGS: Cervix:  Appears closed.  Length: 3.9 cm. Uterus/Adnexae: No abnormality visualized. IMPRESSION: 1. Single live intrauterine gestation in breech presentation. 2. Active fetal heart tones at 150 bpm. 3. Amniotic fluid index of 19.2 cm, within normal limits. This exam is performed on an emergent basis and does not comprehensively evaluate fetal size, dating, or anatomy; follow-up complete OB US should be considered if further fetal assessment is warranted. Electronically Signed   By: Duanne Guess D.O.   On: 02/11/2020 08:12   DG Chest Portable 1 View  Result Date: 02/11/2020 CLINICAL DATA:  COVID positive.  Cough. EXAM: PORTABLE CHEST 1 VIEW COMPARISON:  None. FINDINGS: Patchy bilateral pneumonia. Negative for air leak. Low volume chest. Normal heart size and mediastinal contours for technique. IMPRESSION: Multifocal pneumonia. Electronically Signed   By: Marnee Spring M.D.   On: 02/11/2020 07:43     Assessment and Plan: Patient Active Problem List   Diagnosis Date Noted  . Pneumonia due to COVID-19 virus 02/11/2020  . Hypothyroidism 02/11/2020  . Microcytic anemia 02/11/2020  . Depression 02/11/2020  . Pregnancy 02/11/2020  . Abdominal pain 02/11/2020  . Ear pain, right 02/11/2020  . [redacted] weeks gestation of pregnancy 02/11/2020  .  Supervision of other normal pregnancy, antepartum 10/30/2019  . Obesity affecting pregnancy in second trimester 10/30/2019  . Positive depression screening 10/30/2019  . Abnormal glucose tolerance test (GTT) during pregnancy, antepartum 10/29/2019  . Anemia in pregnancy, unspecified trimester 10/29/2019  . Susceptible to  Varicella (non-immune), currently pregnant in second trimester 10/29/2019  . Rubella non-immune status, antepartum 10/29/2019  . S/P emergency C-section 09/11/2014  . History of gestational diabetes 09/09/2014   Admit to Antenatal Pt admitted for covid-19 treatment.  Medicine was present during my evaluation , and they will treat with remdesivir and steroids.  They agreed with prophylactic lovenox.  Routine antenatal care  Mariel Aloe, MD, [facility] Faculty attending, Center for [facility]

## 2020-02-11 NOTE — Consult Note (Signed)
Medical Consultation   [name] Kidd  ZOX:[id number]  DOB: 07-25-1990  DOA: 02/11/2020  PCP: Gorden HarmsMcManus, Brenna, PA-C    Outpatient Specialists:   Requesting physician: -Dr. Manson PasseyBrown of ED, and Dr. Elesa MassedWard and Dr. Feliberto GottronSchermerhorn of OB/Gyn    Reason for consultation: -COVID-19 infection   History of Present Illness: [name] Kidd is an 29 y.o. female anemia, hypothyroidism, depression, 30 week pregnancy, who presents with shortness of breath.  Patient states that she has been having shortness of breath for more than 1 week, which has been progressively worsening. Patient states that she had COVID-19 test performed but has yet to receive the results.  She has dry cough.  She also reports intermittent mild frontal chest pain earlier, which has resolved currently. She has subjective fever, no chills.  Her body temperature is 99.3 in ED.  Patient also has nausea, vomiting and abdominal pain.  Her abdominal pain is located in the upper abdomen, constant, mild, nonradiating.  She has had several times of nonbloody nonbloody vomiting when she eats food.  Denies diarrhea.  She also reports right ear pain, no ear drainage or hearing loss.  Patient is not vaccinated against COVID-19.  No vaginal bleeding or discharge.  Lab: Positive Covid PCR, WBC 6.7, electrolytes renal function okay, temperature 99.3, blood pressure 120/75, tachypnea with heart rate 35-40, tachycardia with heart rate of 112, oxygen saturation 98% on room air.  Chest x-ray showed multifocal bilateral infiltration.  CTA negative for PE.  US-Ob limited study 1. Single live intrauterine gestation in breech presentation. 2. Active fetal heart tones at 150 bpm. 3. Amniotic fluid index of 19.2 cm, within normal limits.  Review of Systems:   General: has objective fevers, no chills, no changes in body weight, no changes in appetite Skin: no rash HEENT: no blurry vision, or sore throat. Has  right ear pain. Pulm: has dyspnea, coughing, no wheezing CV: has chest pain,  shortness of breath, no palpitations, Abd: has nausea/vomiting, abdominal pain, no diarrhea/constipation GU: no dysuria, hematuria, polyuria Ext: no arthralgias, myalgias Neuro: no weakness, numbness, or tingling Ob/Gyn: no vaginal bleeding    Past Medical History: Past Medical History:  Diagnosis Date  . Anemia    2015 pregnancy  . Dental caries   . Depression 2006   History of suicide attempt  . Gestational diabetes    2016-took Glyburide-up to 3.75mg  bid  . History of positive PPD    Per record-completed treatment 05/29/2008  . Hypothyroidism    Hx subclinical hypothyroidism  . Pneumonia due to COVID-19 virus 02/11/2020  . PPD positive, treated 2010   Negative by CXR  . Vaginal Pap smear, abnormal    2015-[facility]    Past Surgical History: Past Surgical History:  Procedure Laterality Date  . CESAREAN SECTION N/A 09/10/2014   Procedure: CESAREAN SECTION;  Surgeon: Suzy Bouchardhomas J Schermerhorn, MD;  Location: [facility] ORS;  Service: Obstetrics;  Laterality: N/A;     Allergies:   Allergies  Allergen Reactions  . Condoms - Female [Nonoxynol 9] Other (See Comments)    Reaction:  Unknown     Social History:  reports that she has quit smoking. She has never used smokeless tobacco. She reports current alcohol use. She reports that she does not use drugs.   Family History: Family History  Problem Relation Age of Onset  . Cancer Maternal Grandmother   . Hypertension Maternal Grandmother   .  Hypertension Mother   . Diabetes Mother   . Cancer Mother   . Hypertension Father   . Diabetes Father   . Heart defect Nephew     Physical Exam: Vitals:   02/11/20 0337 02/11/20 0605 02/11/20 0803 02/11/20 0830  BP:  120/75 103/71 91/62  Pulse:  (!) 107 (!) 105 (!) 103  Resp:  (!) 35 (!) 36 (!) 40  Temp:      SpO2:  98% 97% 94%  Weight: 83.9 kg     Height: 5\' 4"  (1.626 m)       General: Not in acute  distress HEENT: PERRL, EOMI, no scleral icterus, No JVD or bruit. No ear drainage. Cardiac: S1/S2, RRR, No murmurs, gallops or rubs Pulm: has coarse breathing sound bilaterally Abd: Soft, has mild tenderness in upper abdomen, no rebound pain, no organomegaly, BS present.  Abdominal size is consistent with 30 weeks of pregnancy. Ext: No edema. 1+DP/PT pulse bilaterally Musculoskeletal: No joint deformities, erythema, or stiffness, ROM full Skin: No rashes.  Neuro: Alert and oriented X3, cranial nerves II-XII grossly intact, moves all extremities normally Psych: Patient is not psychotic, no suicidal or hemocidal ideation.       Data reviewed:  I have personally reviewed following labs and imaging studies Labs:  CBC: Recent Labs  Lab 02/11/20 0701  WBC 6.7  HGB 11.0*  HCT 33.1*  MCV 83.6  PLT 194    Basic Metabolic Panel: Recent Labs  Lab 02/11/20 0701  NA 132*  K 3.9  CL 107  CO2 12*  GLUCOSE 80  BUN <5*  CREATININE 0.52  CALCIUM 8.0*   GFR Estimated Creatinine Clearance: 110.7 mL/min (by C-G formula based on SCr of 0.52 mg/dL). Liver Function Tests: Recent Labs  Lab 02/11/20 0701  AST 104*  ALT 55*  ALKPHOS 90  BILITOT 1.0  PROT 6.2*  ALBUMIN 2.5*   No results for input(s): LIPASE, AMYLASE in the last 168 hours. No results for input(s): AMMONIA in the last 168 hours. Coagulation profile No results for input(s): INR, PROTIME in the last 168 hours.  Cardiac Enzymes: No results for input(s): CKTOTAL, CKMB, CKMBINDEX, TROPONINI in the last 168 hours. BNP: Invalid input(s): POCBNP CBG: No results for input(s): GLUCAP in the last 168 hours. D-Dimer No results for input(s): DDIMER in the last 72 hours. Hgb A1c No results for input(s): HGBA1C in the last 72 hours. Lipid Profile Recent Labs    02/11/20 0701  TRIG 205*   Thyroid function studies No results for input(s): TSH, T4TOTAL, T3FREE, THYROIDAB in the last 72 hours.  Invalid input(s):  FREET3 Anemia work up Recent Labs    02/11/20 0757  FERRITIN 90   Urinalysis    Component Value Date/Time   COLORURINE COLORLESS (A) 09/20/2016 2154   APPEARANCEUR CLEAR (A) 09/20/2016 2154   APPEARANCEUR Turbid 01/17/2014 0021   LABSPEC 1.002 (L) 09/20/2016 2154   LABSPEC 1.024 01/17/2014 0021   PHURINE 6.0 09/20/2016 2154   GLUCOSEU NEGATIVE 09/20/2016 2154   GLUCOSEU Negative 01/17/2014 0021   HGBUR SMALL (A) 09/20/2016 2154   BILIRUBINUR NEGATIVE 09/20/2016 2154   BILIRUBINUR Negative 01/17/2014 0021   KETONESUR NEGATIVE 09/20/2016 2154   PROTEINUR NEGATIVE 09/20/2016 2154   NITRITE NEGATIVE 09/20/2016 2154   LEUKOCYTESUR NEGATIVE 09/20/2016 2154   LEUKOCYTESUR 2+ 01/17/2014 0021     Microbiology Recent Results (from the past 240 hour(s))  Resp Panel by RT PCR (RSV, Flu A&B, Covid) - Nasopharyngeal Swab     Status:  Abnormal   Collection Time: 02/11/20  3:45 AM   Specimen: Nasopharyngeal Swab  Result Value Ref Range Status   SARS Coronavirus 2 by RT PCR POSITIVE (A) NEGATIVE Final    Comment: RESULT CALLED TO, READ BACK BY AND VERIFIED WITH:  LISA THOMPSON AT 1610 01/12/20 SDR (NOTE) SARS-CoV-2 target nucleic acids are DETECTED.  SARS-CoV-2 RNA is generally detectable in upper respiratory specimens  during the acute phase of infection. Positive results are indicative of the presence of the identified virus, but do not rule out bacterial infection or co-infection with other pathogens not detected by the test. Clinical correlation with patient history and other diagnostic information is necessary to determine patient infection status. The expected result is Negative.  Fact Sheet for Patients:  https://www.moore.com/  Fact Sheet for Healthcare Providers: https://www.young.biz/  This test is not yet approved or cleared by the [place] FDA and  has been authorized for detection and/or diagnosis of SARS-CoV-2 by FDA under  an Emergency Use Authorization (EUA).  This EUA will remain in effect (meaning this test can be u sed) for the duration of  the COVID-19 declaration under Section 564(b)(1) of the Act, 21 U.S.C. section 360bbb-3(b)(1), unless the authorization is terminated or revoked sooner.      Influenza A by PCR NEGATIVE NEGATIVE Final   Influenza B by PCR NEGATIVE NEGATIVE Final    Comment: (NOTE) The Xpert Xpress SARS-CoV-2/FLU/RSV assay is intended as an aid in  the diagnosis of influenza from Nasopharyngeal swab specimens and  should not be used as a sole basis for treatment. Nasal washings and  aspirates are unacceptable for Xpert Xpress SARS-CoV-2/FLU/RSV  testing.  Fact Sheet for Patients: https://www.moore.com/  Fact Sheet for Healthcare Providers: https://www.young.biz/  This test is not yet approved or cleared by the [place] FDA and  has been authorized for detection and/or diagnosis of SARS-CoV-2 by  FDA under an Emergency Use Authorization (EUA). This EUA will remain  in effect (meaning this test can be used) for the duration of the  Covid-19 declaration under Section 564(b)(1) of the Act, 21  U.S.C. section 360bbb-3(b)(1), unless the authorization is  terminated or revoked.    Respiratory Syncytial Virus by PCR NEGATIVE NEGATIVE Final    Comment: (NOTE) Fact Sheet for Patients: https://www.moore.com/  Fact Sheet for Healthcare Providers: https://www.young.biz/  This test is not yet approved or cleared by the [place] FDA and  has been authorized for detection and/or diagnosis of SARS-CoV-2 by  FDA under an Emergency Use Authorization (EUA). This EUA will remain  in effect (meaning this test can be used) for the duration of the  COVID-19 declaration under Section 564(b)(1) of the Act, 21 U.S.C.  section 360bbb-3(b)(1), unless the authorization is terminated or  revoked. Performed at  [facility], [address] [place], [place] [address]        Inpatient Medications:   Scheduled Meds: Continuous Infusions:   Radiological Exams on Admission: CT Angio Chest PE W and/or Wo Contrast  Result Date: 02/11/2020 CLINICAL DATA:  Shortness of breath.  COVID-19 positive. EXAM: CT ANGIOGRAPHY CHEST WITH CONTRAST TECHNIQUE: Multidetector CT imaging of the chest was performed using the standard protocol during bolus administration of intravenous contrast. Multiplanar CT image reconstructions and MIPs were obtained to evaluate the vascular anatomy. CONTRAST:  75mL OMNIPAQUE IOHEXOL 350 MG/ML SOLN COMPARISON:  Chest radiograph February 11, 2020 FINDINGS: Cardiovascular: There is no demonstrable pulmonary embolus. There is no thoracic aortic aneurysm or dissection. Visualized great vessels  appear normal. No evident pericardial effusion or pericardial thickening. Mediastinum/Nodes: Visualized thyroid appears normal. No evident adenopathy. There is a small hiatal hernia. Lungs/Pleura: There is widespread airspace opacity throughout the lungs bilaterally involving virtually all lobes in segments to varying degrees. No cavitary areas evident. No pleural effusions. Upper Abdomen: Visualized upper abdominal structures appear unremarkable. Musculoskeletal: No blastic or lytic bone lesions. No evident chest wall lesions. Review of the MIP images confirms the above findings. IMPRESSION: 1. No demonstrable pulmonary embolus. No thoracic aortic aneurysm or dissection. 2. Widespread airspace opacity consistent with multifocal pneumonia. Appearance consistent with known COVID 19 positive status. 3.  No evident adenopathy. 4.  Small hiatal hernia. Electronically Signed   By: Bretta Bang III M.D.   On: 02/11/2020 10:49   US OB Limited  Result Date: 02/11/2020 CLINICAL DATA:  Decreased fetal movement EXAM: LIMITED OBSTETRIC ULTRASOUND FINDINGS: Number of Fetuses: 1 Heart Rate:  150 bpm  Movement: Yes Presentation: Breech Placental Location: Posterior Previa: No Amniotic Fluid (Subjective):  Within normal limits. AFI: 19.2 cm BPD: 7.2 cm 29 w  0 d MATERNAL FINDINGS: Cervix:  Appears closed.  Length: 3.9 cm. Uterus/Adnexae: No abnormality visualized. IMPRESSION: 1. Single live intrauterine gestation in breech presentation. 2. Active fetal heart tones at 150 bpm. 3. Amniotic fluid index of 19.2 cm, within normal limits. This exam is performed on an emergent basis and does not comprehensively evaluate fetal size, dating, or anatomy; follow-up complete OB US should be considered if further fetal assessment is warranted. Electronically Signed   By: Duanne Guess D.O.   On: 02/11/2020 08:12   DG Chest Portable 1 View  Result Date: 02/11/2020 CLINICAL DATA:  COVID positive.  Cough. EXAM: PORTABLE CHEST 1 VIEW COMPARISON:  None. FINDINGS: Patchy bilateral pneumonia. Negative for air leak. Low volume chest. Normal heart size and mediastinal contours for technique. IMPRESSION: Multifocal pneumonia. Electronically Signed   By: Marnee Spring M.D.   On: 02/11/2020 07:43    Impression/Recommendations Principal Problem:   Pneumonia due to COVID-19 virus Active Problems:   Hypothyroidism   Microcytic anemia   Depression   Pregnancy   Abdominal pain   Ear pain, right    Pneumonia due to COVID-19 virus: Chest x-ray showed multifocal infiltration.  Oxygen saturation is okay at 98% on room air, but the patient has tachycardia with heart rate of 112 and tachypnea with RR 35-40s. CT angiogram is negative for PE. Per Ob/Gyn, Heloise Ochoa, it is okay to give patient Solu-Medrol 40 mg twice daily. I consulted Dr. Jayme Cloud of intensive care, who recommended to transfer patient to tertiary facility since patient is at high risk.  I talked to Dr. Lenard Lance of ED, who is working on transfer patient to tertiary facility currently.  -will admit to med-surg bed as inpt -Remdesivir per  pharm -Solumedrol 40 mg bid -vitamin C, zinc.  -Bronchodilators -PRN Mucinex for cough -f/u Blood culture -D-dimer, BNP,Trop, LFT, CRP, LDH, Procalcitonin, Ferritin, fibinogen, TG, Hep B SAg, HIV ab -Daily CRP, Ferritin, D-dimer, -if it is okay with OB/GYN, may ask the patient to maintain an awake prone position for 16+ hours a day, if possible, with a minimum of 2-3 hours at a time -Will attempt to maintain euvolemia to a net negative fluid status  Ear pain, right: no ear drainage -Started Augmentin empirically (I discussed with OB/GYN. It is okay to give patient Augmentin per  Heloise Ochoa) -Blood culture -As needed Tylenol   Hypothyroidism: not taking meds now -check TSH  Microcytic anemia: Hgb 11.0 -f/u by CBC  Depression: no SI or HI. Not taking meds currently -Observe closely  Pregnancy of 30 weeks: No vaginal bleeding -Management per OB/GYN team  Abdominal pain, nausea, vomiting: Possibly due to COVID-19 gastritis.  -As needed Zofran -check lipase  US-Ob limited study 1. Single live intrauterine gestation in breech presentation. 2. Active fetal heart tones at 150 bpm. 3. Amniotic fluid index of 19.2 cm, within normal limits.      Thank you for this consultation.  Our [facility] hospitalist team will follow the patient with you.   Time Spent: 35 min  Lorretta Harp M.D. [facility] 02/11/2020, 11:53 AM

## 2020-02-11 NOTE — ED Provider Notes (Signed)
[facility] Emergency Department Provider Note  ____________________________________________   First MD Initiated Contact with Patient 02/11/20 [phone]     (approximate)  I have reviewed the triage vital signs and the nursing notes.   HISTORY  Chief Complaint Cough and Otalgia    HPI [name] Kidd is a 29 y.o. female G2, P1 approximately [redacted] weeks pregnant presents to the emergency department secondary to cough congestion dyspnea since February 04, 2020.  Patient also admits to right ear pain and vomiting.  So admits to subjective fevers.  Patient states that she had a COVID-19 test performed but has yet to receive the results.  No known sick contact.        Past Medical History:  Diagnosis Date  . Anemia    2015 pregnancy  . Dental caries   . Depression 2006   History of suicide attempt  . Gestational diabetes    2016-took Glyburide-up to 3.75mg  bid  . History of positive PPD    Per record-completed treatment 05/29/2008  . Hypothyroidism    Hx subclinical hypothyroidism  . Pneumonia due to COVID-19 virus 02/11/2020  . PPD positive, treated 2010   Negative by CXR  . Vaginal Pap smear, abnormal    2015-ASCUS    Patient Active Problem List   Diagnosis Date Noted  . Pneumonia due to COVID-19 virus 02/11/2020  . Hypothyroidism 02/11/2020  . Microcytic anemia 02/11/2020  . Depression 02/11/2020  . Pregnancy 02/11/2020  . Supervision of other normal pregnancy, antepartum 10/30/2019  . Obesity affecting pregnancy in second trimester 10/30/2019  . Positive depression screening 10/30/2019  . Abnormal glucose tolerance test (GTT) during pregnancy, antepartum 10/29/2019  . Anemia in pregnancy, unspecified trimester 10/29/2019  . Susceptible to Varicella (non-immune), currently pregnant in second trimester 10/29/2019  . Rubella non-immune status, antepartum 10/29/2019  . S/P emergency C-section 09/11/2014  . History of gestational  diabetes 09/09/2014    Past Surgical History:  Procedure Laterality Date  . CESAREAN SECTION N/A 09/10/2014   Procedure: CESAREAN SECTION;  Surgeon: Suzy Bouchard, MD;  Location: [facility] ORS;  Service: Obstetrics;  Laterality: N/A;    Prior to Admission medications   Medication Sig Start Date End Date Taking? Authorizing Provider  aspirin (ASPIRIN 81) 81 MG EC tablet Take 1 tablet (81 mg total) by mouth daily. Swallow whole. 10/30/19 03/20/20  Streilein, Mathis Dad, PA-C  docusate sodium (COLACE) 100 MG capsule Take 1 capsule (100 mg total) by mouth 2 (two) times daily as needed for mild constipation. Patient not taking: Reported on 10/30/2019 09/14/14   Christeen Douglas, MD  doxylamine, Sleep, (UNISOM) 25 MG tablet Take 25 mg by mouth at bedtime as needed. 1/2 tab by mouth twice daily for N/V of pregnancy; use along with pyridoxine    [provider]  fluticasone (FLONASE) 50 MCG/ACT nasal spray Place 2 sprays into both nostrils daily. For allergies    [provider]  lanolin OINT Apply 1 application topically as needed (for breast care). Patient not taking: Reported on 10/30/2019 09/11/14   Christeen Douglas, MD  metoCLOPramide (REGLAN) 10 MG tablet Take 10 mg by mouth every 6 (six) hours as needed for nausea. 1 by mouth every 6 hours as needed for N/V    [provider]  Misc. Devices (AFFINITY PRO BREAST PUMP) MISC Any brand breastpump Dx: Breastfeeding Patient not taking: Reported on 10/30/2019 09/11/14   Christeen Douglas, MD  norethindrone (ORTHO MICRONOR) 0.35 MG tablet Take 1 tablet (0.35 mg  total) by mouth daily. Patient not taking: Reported on 10/30/2019 09/14/14   Christeen Douglas, MD  oxyCODONE-acetaminophen (PERCOCET/ROXICET) 5-325 MG per tablet Take 1-2 tablets by mouth every 4 (four) hours as needed (for pain scale 4-7). Patient not taking: Reported on 10/30/2019 09/14/14   Christeen Douglas, MD  phenazopyridine (PYRIDIUM) 200 MG tablet Take 1 tablet (200 mg  total) by mouth 3 (three) times daily as needed for pain. Patient not taking: Reported on 10/30/2019 09/20/16   Chinita Pester, FNP  Prenatal Vit-Fe Fumarate-FA (PRENATAL MULTIVITAMIN) TABS tablet Take 1 tablet by mouth daily.    [provider]  pyridOXINE (VITAMIN B-6) 25 MG tablet Take 25 mg by mouth daily. 1 tablet by mouth every 6 hours as needed for N/V of pregnancy    [provider]  sulfamethoxazole-trimethoprim (BACTRIM DS,SEPTRA DS) 800-160 MG tablet Take 1 tablet by mouth 2 (two) times daily. Patient not taking: Reported on 10/30/2019 09/20/16   Chinita Pester, FNP    Allergies Condoms - female [nonoxynol 9]  Family History  Problem Relation Age of Onset  . Cancer Maternal Grandmother   . Hypertension Maternal Grandmother   . Hypertension Mother   . Diabetes Mother   . Cancer Mother   . Hypertension Father   . Diabetes Father   . Heart defect Nephew     Social History Social History   Tobacco Use  . Smoking status: Former Games developer  . Smokeless tobacco: Never Used  Substance Use Topics  . Alcohol use: Yes    Comment: weekend-liquor  . Drug use: No    Review of Systems Constitutional: Positive for fever/chills Eyes: No visual changes. ENT: No sore throat. Cardiovascular: Denies chest pain. Respiratory: Positive for cough and dyspnea Gastrointestinal: No abdominal pain.  No nausea, no vomiting.  No diarrhea.  No constipation. Genitourinary: Negative for dysuria. Musculoskeletal: Negative for neck pain.  Negative for back pain. Integumentary: Negative for rash. Neurological: Negative for headaches, focal weakness or numbness.  ____________________________________________   PHYSICAL EXAM:  VITAL SIGNS: ED Triage Vitals  Enc Vitals Group     BP 02/11/20 0336 113/67     Pulse Rate 02/11/20 0336 (!) 112     Resp 02/11/20 0336 (!) 22     Temp 02/11/20 0336 99.3 F (37.4 C)     Temp src --      SpO2 02/11/20 0336 100 %     Weight 02/11/20  0337 83.9 kg (185 lb)     Height 02/11/20 0337 1.626 m (5\' 4" )     Head Circumference --      Peak Flow --      Pain Score 02/11/20 0336 4     Pain Loc --      Pain Edu? --      Excl. in GC? --     Constitutional: Alert and oriented. Eyes: Conjunctivae are normal.  Head: Atraumatic. Mouth/Throat: Patient is wearing a mask. Neck: No stridor.  No meningeal signs.   Cardiovascular: Normal rate, regular rhythm. Good peripheral circulation. Grossly normal heart sounds. Respiratory: Normal respiratory effort.  No retractions. Gastrointestinal: Soft and nontender. No distention.  Musculoskeletal: No lower extremity tenderness nor edema. No gross deformities of extremities. Neurologic:  Normal speech and language. No gross focal neurologic deficits are appreciated.  Skin:  Skin is warm, dry and intact. Psychiatric: Mood and affect are normal. Speech and behavior are normal.  ____________________________________________   LABS (all labs ordered are listed, but only abnormal results are displayed)  Labs Reviewed  RESP PANEL BY RT PCR (RSV, FLU A&B, COVID) - Abnormal; Notable for the following components:      Result Value   SARS Coronavirus 2 by RT PCR POSITIVE (*)    All other components within normal limits  CBC  COMPREHENSIVE METABOLIC PANEL  FIBRIN DERIVATIVES D-DIMER ([facility] ONLY)      _______________________________________ Procedures   ____________________________________________   INITIAL IMPRESSION / MDM / ASSESSMENT AND PLAN / ED COURSE  As part of my medical decision making, I reviewed the following data within the electronic MEDICAL RECORD NUMBER  29 year old 30-week pregnant female with history physical exam consistent with Covid pneumonia which was confirmed on chest x-ray.  Patient tachypneic with respiratory rate of 35-40 at present however oxygen saturation on room air 95%.  Chest x-ray findings consistent with Covid pneumonia.  Given decreased fetal movement  pregnancy ultrasound ordered.  Patient subsequently discussed with Dr. Feliberto Gottron for hospital admission for further evaluation and management.  Patient then discussed with Dr. Clyde Lundborg of the hospitalist staff who will consult on the patient.  Dr. Feliberto Gottron advised that the patient should be sent to labor and delivery. ____________________________________________  FINAL CLINICAL IMPRESSION(S) / ED DIAGNOSES  Final diagnoses:  Pneumonia due to COVID-19 virus     MEDICATIONS GIVEN DURING THIS VISIT:  Medications  ascorbic acid (VITAMIN C) tablet 500 mg (has no administration in time range)  zinc sulfate capsule 220 mg (has no administration in time range)  ipratropium (ATROVENT HFA) inhaler 2 puff (has no administration in time range)  albuterol (VENTOLIN HFA) 108 (90 Base) MCG/ACT inhaler 2 puff (has no administration in time range)  dextromethorphan-guaiFENesin (MUCINEX DM) 30-600 MG per 12 hr tablet 1 tablet (has no administration in time range)  ondansetron (ZOFRAN) tablet 4 mg (has no administration in time range)  acetaminophen (TYLENOL) tablet 650 mg (has no administration in time range)     ED Discharge Orders    None      *Please note:  [name] Kidd was evaluated in Emergency Department on 02/11/2020 for the symptoms described in the history of present illness. She was evaluated in the context of the global COVID-19 pandemic, which necessitated consideration that the patient might be at risk for infection with the SARS-CoV-2 virus that causes COVID-19. Institutional protocols and algorithms that pertain to the evaluation of patients at risk for COVID-19 are in a state of rapid change based on information released by regulatory bodies including the CDC and federal and state organizations. These policies and algorithms were followed during the patient's care in the ED.  Some ED evaluations and interventions may be delayed as a result of limited staffing during and  after the pandemic.*  Note:  This document was prepared using Dragon voice recognition software and may include unintentional dictation errors.   Darci Current, MD 02/11/20 [phone]

## 2020-02-12 DIAGNOSIS — E039 Hypothyroidism, unspecified: Secondary | ICD-10-CM

## 2020-02-12 DIAGNOSIS — O24410 Gestational diabetes mellitus in pregnancy, diet controlled: Secondary | ICD-10-CM | POA: Diagnosis present

## 2020-02-12 LAB — CBC WITH DIFFERENTIAL/PLATELET
Abs Immature Granulocytes: 0.03 10*3/uL (ref 0.00–0.07)
Basophils Absolute: 0 10*3/uL (ref 0.0–0.1)
Basophils Relative: 0 %
Eosinophils Absolute: 0 10*3/uL (ref 0.0–0.5)
Eosinophils Relative: 0 %
HCT: 36.3 % (ref 36.0–46.0)
Hemoglobin: 11.6 g/dL — ABNORMAL LOW (ref 12.0–15.0)
Immature Granulocytes: 1 %
Lymphocytes Relative: 31 %
Lymphs Abs: 1.1 10*3/uL (ref 0.7–4.0)
MCH: 27.2 pg (ref 26.0–34.0)
MCHC: 32 g/dL (ref 30.0–36.0)
MCV: 85.2 fL (ref 80.0–100.0)
Monocytes Absolute: 0.2 10*3/uL (ref 0.1–1.0)
Monocytes Relative: 5 %
Neutro Abs: 2.2 10*3/uL (ref 1.7–7.7)
Neutrophils Relative %: 63 %
Platelets: 228 10*3/uL (ref 150–400)
RBC: 4.26 MIL/uL (ref 3.87–5.11)
RDW: 15.8 % — ABNORMAL HIGH (ref 11.5–15.5)
WBC: 3.5 10*3/uL — ABNORMAL LOW (ref 4.0–10.5)
nRBC: 0 % (ref 0.0–0.2)

## 2020-02-12 LAB — COMPREHENSIVE METABOLIC PANEL
ALT: 74 U/L — ABNORMAL HIGH (ref 0–44)
AST: 126 U/L — ABNORMAL HIGH (ref 15–41)
Albumin: 2.2 g/dL — ABNORMAL LOW (ref 3.5–5.0)
Alkaline Phosphatase: 98 U/L (ref 38–126)
Anion gap: 16 — ABNORMAL HIGH (ref 5–15)
BUN: 7 mg/dL (ref 6–20)
CO2: 11 mmol/L — ABNORMAL LOW (ref 22–32)
Calcium: 8.8 mg/dL — ABNORMAL LOW (ref 8.9–10.3)
Chloride: 111 mmol/L (ref 98–111)
Creatinine, Ser: 0.52 mg/dL (ref 0.44–1.00)
GFR calc non Af Amer: >60 mL/min (ref >60)
Glucose, Bld: 95 mg/dL (ref 70–99)
Potassium: 3.6 mmol/L (ref 3.5–5.1)
Sodium: 138 mmol/L (ref 135–145)
Total Bilirubin: 0.6 mg/dL (ref 0.3–1.2)
Total Protein: 6 g/dL — ABNORMAL LOW (ref 6.5–8.1)

## 2020-02-12 LAB — PHOSPHORUS: Phosphorus: 4.4 mg/dL (ref 2.5–4.6)

## 2020-02-12 LAB — MAGNESIUM: Magnesium: 1.6 mg/dL — ABNORMAL LOW (ref 1.7–2.4)

## 2020-02-12 LAB — C-REACTIVE PROTEIN: CRP: 8.6 mg/dL — ABNORMAL HIGH (ref <1.0)

## 2020-02-12 LAB — GLUCOSE, CAPILLARY
Glucose-Capillary: 149 mg/dL — ABNORMAL HIGH (ref 70–99)
Glucose-Capillary: 159 mg/dL — ABNORMAL HIGH (ref 70–99)
Glucose-Capillary: 111 mg/dL — ABNORMAL HIGH (ref 70–99)

## 2020-02-12 LAB — D-DIMER, QUANTITATIVE: D-Dimer, Quant: 0.84 ug/mL-FEU — ABNORMAL HIGH (ref 0.00–0.50)

## 2020-02-12 MED ORDER — ALBUTEROL SULFATE HFA 108 (90 BASE) MCG/ACT IN AERS
2.0000 | INHALATION_SPRAY | RESPIRATORY_TRACT | Status: DC | PRN
  Administered 2020-02-15 (×2): 2 via RESPIRATORY_TRACT

## 2020-02-12 NOTE — Progress Notes (Signed)
FACULTY PRACTICE ANTEPARTUM PROGRESS NOTE  [name] Kidd is a 29 y.o. G2P1001 at [redacted]w[redacted]d who is admitted for covid-19 respiratory infection.  Estimated Date of Delivery: 04/17/20 Fetal presentation is unsure.  Length of Stay:  1 Days. Admitted 02/11/2020  Subjective: Pt seen.  She states she feels better and is not breathing as fast.  She denies any fever/ chills.  Patient reports normal fetal movement.  She denies uterine contractions, denies bleeding and leaking of fluid per vagina.  Vitals:  Blood pressure 106/63, pulse 87, temperature 98.3 F (36.8 C), temperature source Oral, resp. rate (!) 40, height 5\' 4"  (1.626 m), weight 83.9 kg, last menstrual period 07/26/2019, SpO2 94 %, unknown if currently breastfeeding. Physical Examination: CONSTITUTIONAL: Well-developed, well-nourished female in no acute distress.  HENT:  Normocephalic, atraumatic, External right and left ear normal. Oropharynx is clear and moist EYES: Conjunctivae and EOM are normal. Pupils are equal, round, and reactive to light. No scleral icterus.  NECK: Normal range of motion, supple, no masses. SKIN: Skin is warm and dry. No rash noted. Not diaphoretic. No erythema. No pallor. NEUROLGIC: Alert and oriented to person, place, and time. Normal reflexes, muscle tone coordination. No cranial nerve deficit noted. PSYCHIATRIC: Normal mood and affect. Normal behavior. Normal judgment and thought content. CARDIOVASCULAR: Normal heart rate noted, regular rhythm RESPIRATORY: Effort normal and breath sounds normal, no problems with respiration noted currently.  Pt still notes cough. MUSCULOSKELETAL: Normal range of motion. No edema and no tenderness. ABDOMEN: Soft, nontender, nondistended, gravid. CERVIX: deferred  Fetal monitoring: FHR: 125 bpm, Variability: moderate, Accelerations: Present, Decelerations: Absent  Uterine activity: none  Results for orders placed or performed during the hospital encounter of  02/11/20 (from the past 48 hour(s))  CBC     Status: None   Collection Time: 02/11/20  4:11 PM  Result Value Ref Range   WBC 5.2 4.0 - 10.5 K/uL   RBC 4.31 3.87 - 5.11 MIL/uL   Hemoglobin 12.0 12.0 - 15.0 g/dL   HCT 04/12/20 36 - 46 %   MCV 85.2 80.0 - 100.0 fL   MCH 27.8 26.0 - 34.0 pg   MCHC 32.7 30.0 - 36.0 g/dL   RDW 22.9 79.8 - 92.1 %   Platelets 216 150 - 400 K/uL   nRBC 0.0 0.0 - 0.2 %    Comment: Performed at [facility], [address] [place], [place]  Creatinine, serum     Status: None   Collection Time: 02/11/20  4:11 PM  Result Value Ref Range   Creatinine, Ser 0.58 0.44 - 1.00 mg/dL   GFR calc non Af Amer >60 >60 mL/min    Comment: Performed at [facility], [address], [place], [place]  Type and screen [facility]     Status: None   Collection Time: 02/11/20  5:01 PM  Result Value Ref Range   ABO/RH(D) O POS    Antibody Screen NEG    Sample Expiration      02/14/2020,2359 Performed at [facility], [address], [place], [place]   Comprehensive metabolic panel     Status: Abnormal   Collection Time: 02/12/20  5:55 AM  Result Value Ref Range   Sodium 138 135 - 145 mmol/L   Potassium 3.6 3.5 - 5.1 mmol/L   Chloride 111 98 - 111 mmol/L   CO2 11 (L) 22 - 32 mmol/L   Glucose, Bld 95 70 -  99 mg/dL    Comment: Glucose reference range applies only to samples taken after fasting for at least 8 hours.   BUN 7 6 - 20 mg/dL   Creatinine, Ser 9.17 0.44 - 1.00 mg/dL   Calcium 8.8 (L) 8.9 - 10.3 mg/dL   Total Protein 6.0 (L) 6.5 - 8.1 g/dL   Albumin 2.2 (L) 3.5 - 5.0 g/dL   AST 915 (H) 15 - 41 U/L   ALT 74 (H) 0 - 44 U/L   Alkaline Phosphatase 98 38 - 126 U/L   Total Bilirubin 0.6 0.3 - 1.2 mg/dL   GFR calc non Af Amer >60 >60 mL/min   Anion gap 16 (H) 5 - 15    Comment: Performed at [facility], [address], [place], [place] [address]  C-reactive protein     Status: Abnormal    Collection Time: 02/12/20  5:55 AM  Result Value Ref Range   CRP 8.6 (H) <1.0 mg/dL    Comment: Performed at [facility], [address], [place], [place] [address]  CBC with Differential/Platelet     Status: Abnormal   Collection Time: 02/12/20  5:55 AM  Result Value Ref Range   WBC 3.5 (L) 4.0 - 10.5 K/uL   RBC 4.26 3.87 - 5.11 MIL/uL   Hemoglobin 11.6 (L) 12.0 - 15.0 g/dL   HCT 65.5 36 - 46 %   MCV 85.2 80.0 - 100.0 fL   MCH 27.2 26.0 - 34.0 pg   MCHC 32.0 30.0 - 36.0 g/dL   RDW 37.4 (H) 82.7 - 07.8 %   Platelets 228 150 - 400 K/uL   nRBC 0.0 0.0 - 0.2 %   Neutrophils Relative % 63 %   Neutro Abs 2.2 1.7 - 7.7 K/uL   Lymphocytes Relative 31 %   Lymphs Abs 1.1 0.7 - 4.0 K/uL   Monocytes Relative 5 %   Monocytes Absolute 0.2 0 - 1 K/uL   Eosinophils Relative 0 %   Eosinophils Absolute 0.0 0 - 0 K/uL   Basophils Relative 0 %   Basophils Absolute 0.0 0 - 0 K/uL   Immature Granulocytes 1 %   Abs Immature Granulocytes 0.03 0.00 - 0.07 K/uL    Comment: Performed at [facility], [address] [place], [place] [address]  D-dimer, quantitative (not at [facility])     Status: Abnormal   Collection Time: 02/12/20  5:55 AM  Result Value Ref Range   D-Dimer, Quant 0.84 (H) 0.00 - 0.50 ug/mL-FEU    Comment: (NOTE) At the manufacturer cut-off of 0.50 ug/mL FEU, this assay has been documented to exclude PE with a sensitivity and negative predictive value of 97 to 99%.  At this time, this assay has not been approved by the FDA to exclude DVT/VTE. Results should be correlated with clinical presentation. Performed at [facility], [address] [place], [place] [address]   Magnesium     Status: Abnormal   Collection Time: 02/12/20  5:55 AM  Result Value Ref Range   Magnesium 1.6 (L) 1.7 - 2.4 mg/dL    Comment: Performed at [facility], [address], [place], [place] [address]  Phosphorus     Status: None   Collection Time: 02/12/20  5:55 AM  Result Value  Ref Range   Phosphorus 4.4 2.5 - 4.6 mg/dL    Comment: Performed at [facility], [address] [place], [place] [address]  I have reviewed the patient's current medications.  ASSESSMENT: Active Problems:   Pneumonia due to COVID-19 virus   Hypothyroidism   [redacted] weeks gestation of pregnancy   Diet controlled gestational diabetes mellitus (GDM) in third trimester   PLAN: Continue Covid -19 treatment per medicine Appreciate medicine assistance. Check with pt to see if she took any meds for the hypothyroid. Per pt she is a diet controlled GDM, recently diagnosed.  Start fasting and postprandial CBG   Continue routine antenatal care.   Mariel Aloe, MD [facility] Faculty Attending, Center for [facility] 02/12/2020 9:55 AM

## 2020-02-12 NOTE — Consult Note (Addendum)
[name] Kidd  KGU:[id number] DOB: 04-22-1991 DOA: 02/11/2020 PCP: Gorden Harms, PA-C    Brief Narrative:  29 year old with a history of subclinical hypothyroidism, depression, and gestational diabetes who is [redacted] weeks pregnant and presented to [facility] with shortness of breath.  She reported generalized body aches and weakness for approximately 1 week leading up to the acute development of shortness of breath.  She also had a significant dry cough.  CXR noted diffuse pulmonary infiltrates consistent with Covid pneumonia.  Oxygen saturations were noted to dip below 94% with ambulation, and she was quite tachypneic.  Significant Events:  10/6 admit to [facility]  Date of Positive COVID Test:  10/6  Vaccination Status: Not vaccinated against Covid  COVID-19 specific Treatment: Remdesivir 10/6 > Steroid 10/6 >  Antimicrobials:  None  DVT prophylaxis: Lovenox  Subjective: Vital signs presently stable.  Patient is not febrile.  Oxygen saturations 93% on support. States she is feeling better overall, but still bothered by a hectic cough. No cp, n/v, or abdom pain.   Assessment & Plan:  COVID Pneumonia -acute hypoxic respiratory failure Continue remdesivir and steroid -significant bilateral infiltrates appreciable on CTa chest - no indication for IL6 / JAK inhibitors presently   Recent Labs  Lab 02/11/20 0701 02/11/20 0757 02/12/20 0555  DDIMER  --   --  0.84*  FERRITIN  --  90  --   CRP  --  10.9* 8.6*  ALT 55*  --  74*  PROCALCITON  --  0.42  --     Mild transaminitis Likely due to Covid itself with possible contribution from use of remdesivir -follow trend  Mild hypomagnesemia We will let OB determine if supplementation indicated/safe  Gestational DM Care per OB  Subclinical hypothyroidism  [redacted] weeks pregnant Care per OB   Code Status: FULL CODE Family Communication: No family present at time of exam  Objective: Blood pressure (!) 104/59, pulse 85,  temperature 98.4 F (36.9 C), temperature source Oral, resp. rate 18, height 5\' 4"  (1.626 m), weight 83.9 kg, last menstrual period 07/26/2019, SpO2 93 %, unknown if currently breastfeeding.  Intake/Output Summary (Last 24 hours) at 02/12/2020 0828 Last data filed at 02/12/2020 0127 Gross per 24 hour  Intake 3 ml  Output --  Net 3 ml   Filed Weights   02/11/20 1600  Weight: 83.9 kg    Examination: General: No acute respiratory distress Lungs: Fine diffuse crackles bilaterally with no wheezing and good air movement Cardiovascular: Regular rate and rhythm without murmur gallop or rub normal S1 and S2 Abdomen: Nontender, nondistended, soft, bowel sounds positive, no rebound, no ascites, no appreciable mass Extremities: No significant cyanosis, clubbing, or edema bilateral lower extremities  CBC: Recent Labs  Lab 02/11/20 0701 02/11/20 1611 02/12/20 0555  WBC 6.7 5.2 3.5*  NEUTROABS  --   --  2.2  HGB 11.0* 12.0 11.6*  HCT 33.1* 36.7 36.3  MCV 83.6 85.2 85.2  PLT 194 216 228   Basic Metabolic Panel: Recent Labs  Lab 02/11/20 0701 02/11/20 1611 02/12/20 0555  NA 132*  --  138  K 3.9  --  3.6  CL 107  --  111  CO2 12*  --  11*  GLUCOSE 80  --  95  BUN <5*  --  7  CREATININE 0.52 0.58 0.52  CALCIUM 8.0*  --  8.8*  MG  --   --  1.6*  PHOS  --   --  4.4   GFR:  Estimated Creatinine Clearance: 110.7 mL/min (by C-G formula based on SCr of 0.52 mg/dL).  Liver Function Tests: Recent Labs  Lab 02/11/20 0701 02/12/20 0555  AST 104* 126*  ALT 55* 74*  ALKPHOS 90 98  BILITOT 1.0 0.6  PROT 6.2* 6.0*  ALBUMIN 2.5* 2.2*   Recent Labs  Lab 02/11/20 0757  LIPASE 51    Recent Results (from the past 240 hour(s))  Resp Panel by RT PCR (RSV, Flu A&B, Covid) - Nasopharyngeal Swab     Status: Abnormal   Collection Time: 02/11/20  3:45 AM   Specimen: Nasopharyngeal Swab  Result Value Ref Range Status   SARS Coronavirus 2 by RT PCR POSITIVE (A) NEGATIVE Final    Comment:  RESULT CALLED TO, READ BACK BY AND VERIFIED WITH:  LISA THOMPSON AT 2409 01/12/20 SDR (NOTE) SARS-CoV-2 target nucleic acids are DETECTED.  SARS-CoV-2 RNA is generally detectable in upper respiratory specimens  during the acute phase of infection. Positive results are indicative of the presence of the identified virus, but do not rule out bacterial infection or co-infection with other pathogens not detected by the test. Clinical correlation with patient history and other diagnostic information is necessary to determine patient infection status. The expected result is Negative.  Fact Sheet for Patients:  https://www.moore.com/  Fact Sheet for Healthcare Providers: https://www.young.biz/  This test is not yet approved or cleared by the [place] FDA and  has been authorized for detection and/or diagnosis of SARS-CoV-2 by FDA under an Emergency Use Authorization (EUA).  This EUA will remain in effect (meaning this test can be u sed) for the duration of  the COVID-19 declaration under Section 564(b)(1) of the Act, 21 U.S.C. section 360bbb-3(b)(1), unless the authorization is terminated or revoked sooner.      Influenza A by PCR NEGATIVE NEGATIVE Final   Influenza B by PCR NEGATIVE NEGATIVE Final    Comment: (NOTE) The Xpert Xpress SARS-CoV-2/FLU/RSV assay is intended as an aid in  the diagnosis of influenza from Nasopharyngeal swab specimens and  should not be used as a sole basis for treatment. Nasal washings and  aspirates are unacceptable for Xpert Xpress SARS-CoV-2/FLU/RSV  testing.  Fact Sheet for Patients: https://www.moore.com/  Fact Sheet for Healthcare Providers: https://www.young.biz/  This test is not yet approved or cleared by the [place] FDA and  has been authorized for detection and/or diagnosis of SARS-CoV-2 by  FDA under an Emergency Use Authorization (EUA). This EUA will  remain  in effect (meaning this test can be used) for the duration of the  Covid-19 declaration under Section 564(b)(1) of the Act, 21  U.S.C. section 360bbb-3(b)(1), unless the authorization is  terminated or revoked.    Respiratory Syncytial Virus by PCR NEGATIVE NEGATIVE Final    Comment: (NOTE) Fact Sheet for Patients: https://www.moore.com/  Fact Sheet for Healthcare Providers: https://www.young.biz/  This test is not yet approved or cleared by the [place] FDA and  has been authorized for detection and/or diagnosis of SARS-CoV-2 by  FDA under an Emergency Use Authorization (EUA). This EUA will remain  in effect (meaning this test can be used) for the duration of the  COVID-19 declaration under Section 564(b)(1) of the Act, 21 U.S.C.  section 360bbb-3(b)(1), unless the authorization is terminated or  revoked. Performed at [facility], [address] [place], [place] [address]   Culture, blood (Routine X 2) w Reflex to ID Panel     Status: None (Preliminary result)   Collection Time: 02/11/20  8:04 AM   Specimen: BLOOD  Result Value Ref Range Status   Specimen Description BLOOD RIGHT ANTECUBITAL  Final   Special Requests   Final    BOTTLES DRAWN AEROBIC AND ANAEROBIC Blood Culture adequate volume   Culture   Final    NO GROWTH < 24 HOURS Performed at [facility], [address] [place], [place] [address]    Report Status PENDING  Incomplete  Culture, blood (Routine X 2) w Reflex to ID Panel     Status: None (Preliminary result)   Collection Time: 02/11/20  8:04 AM   Specimen: BLOOD  Result Value Ref Range Status   Specimen Description BLOOD BLOOD LEFT HAND  Final   Special Requests   Final    BOTTLES DRAWN AEROBIC AND ANAEROBIC Blood Culture adequate volume   Culture   Final    NO GROWTH < 24 HOURS Performed at [facility], [address] [place], [place] [address]    Report Status  PENDING  Incomplete     Scheduled Meds: . albuterol  2 puff Inhalation Q6H  . docusate sodium  100 mg Oral Daily  . enoxaparin (LOVENOX) injection  40 mg Subcutaneous Q24H  . methylPREDNISolone (SOLU-MEDROL) injection  0.5 mg/kg Intravenous Q12H   Followed by  . [START ON 02/14/2020] predniSONE  50 mg Oral Daily  . prenatal multivitamin  1 tablet Oral Q1200  . sodium chloride flush  3 mL Intravenous Q12H   Continuous Infusions: . sodium chloride    . remdesivir 100 mg in NS 100 mL       LOS: 1 day   Lonia Blood, MD [facility] Office  [phone] Pager - Text Page per Amion  If 7PM-7AM, please contact night-coverage per Amion 02/12/2020, 8:28 AM

## 2020-02-13 DIAGNOSIS — O99283 Endocrine, nutritional and metabolic diseases complicating pregnancy, third trimester: Secondary | ICD-10-CM

## 2020-02-13 LAB — GLUCOSE, CAPILLARY
Glucose-Capillary: 103 mg/dL — ABNORMAL HIGH (ref 70–99)
Glucose-Capillary: 138 mg/dL — ABNORMAL HIGH (ref 70–99)
Glucose-Capillary: 127 mg/dL — ABNORMAL HIGH (ref 70–99)
Glucose-Capillary: 122 mg/dL — ABNORMAL HIGH (ref 70–99)

## 2020-02-13 LAB — PHOSPHORUS: Phosphorus: 4.3 mg/dL (ref 2.5–4.6)

## 2020-02-13 LAB — CBC WITH DIFFERENTIAL/PLATELET
Abs Immature Granulocytes: 0.07 10*3/uL (ref 0.00–0.07)
Basophils Absolute: 0 10*3/uL (ref 0.0–0.1)
Basophils Relative: 0 %
Eosinophils Absolute: 0 10*3/uL (ref 0.0–0.5)
Eosinophils Relative: 0 %
HCT: 36.8 % (ref 36.0–46.0)
Hemoglobin: 11.8 g/dL — ABNORMAL LOW (ref 12.0–15.0)
Immature Granulocytes: 2 %
Lymphocytes Relative: 33 %
Lymphs Abs: 1.5 10*3/uL (ref 0.7–4.0)
MCH: 26.9 pg (ref 26.0–34.0)
MCHC: 32.1 g/dL (ref 30.0–36.0)
MCV: 84 fL (ref 80.0–100.0)
Monocytes Absolute: 0.2 10*3/uL (ref 0.1–1.0)
Monocytes Relative: 5 %
Neutro Abs: 2.7 10*3/uL (ref 1.7–7.7)
Neutrophils Relative %: 60 %
Platelets: 252 10*3/uL (ref 150–400)
RBC: 4.38 MIL/uL (ref 3.87–5.11)
RDW: 15.7 % — ABNORMAL HIGH (ref 11.5–15.5)
WBC: 4.5 10*3/uL (ref 4.0–10.5)
nRBC: 0.4 % — ABNORMAL HIGH (ref 0.0–0.2)

## 2020-02-13 LAB — C-REACTIVE PROTEIN: CRP: 3.4 mg/dL — ABNORMAL HIGH (ref <1.0)

## 2020-02-13 LAB — COMPREHENSIVE METABOLIC PANEL
ALT: 120 U/L — ABNORMAL HIGH (ref 0–44)
AST: 190 U/L — ABNORMAL HIGH (ref 15–41)
Albumin: 2.1 g/dL — ABNORMAL LOW (ref 3.5–5.0)
Alkaline Phosphatase: 95 U/L (ref 38–126)
Anion gap: 13 (ref 5–15)
BUN: 10 mg/dL (ref 6–20)
CO2: 12 mmol/L — ABNORMAL LOW (ref 22–32)
Calcium: 8.6 mg/dL — ABNORMAL LOW (ref 8.9–10.3)
Chloride: 113 mmol/L — ABNORMAL HIGH (ref 98–111)
Creatinine, Ser: 0.55 mg/dL (ref 0.44–1.00)
GFR calc non Af Amer: >60 mL/min (ref >60)
Glucose, Bld: 109 mg/dL — ABNORMAL HIGH (ref 70–99)
Potassium: 3.3 mmol/L — ABNORMAL LOW (ref 3.5–5.1)
Sodium: 138 mmol/L (ref 135–145)
Total Bilirubin: 0.7 mg/dL (ref 0.3–1.2)
Total Protein: 5.9 g/dL — ABNORMAL LOW (ref 6.5–8.1)

## 2020-02-13 LAB — MAGNESIUM: Magnesium: 1.6 mg/dL — ABNORMAL LOW (ref 1.7–2.4)

## 2020-02-13 LAB — FERRITIN: Ferritin: 97 ng/mL (ref 11–307)

## 2020-02-13 LAB — D-DIMER, QUANTITATIVE: D-Dimer, Quant: 0.71 ug/mL-FEU — ABNORMAL HIGH (ref 0.00–0.50)

## 2020-02-13 MED ORDER — MAGNESIUM CHLORIDE 64 MG PO TBEC
1.0000 | DELAYED_RELEASE_TABLET | Freq: Two times a day (BID) | ORAL | Status: AC
  Administered 2020-02-13 (×2): 64 mg via ORAL
  Filled 2020-02-13 (×2): qty 1

## 2020-02-13 MED ORDER — POTASSIUM CHLORIDE 20 MEQ PO PACK
20.0000 meq | PACK | Freq: Two times a day (BID) | ORAL | Status: AC
  Administered 2020-02-13 (×2): 20 meq via ORAL
  Filled 2020-02-13 (×2): qty 1

## 2020-02-13 MED ORDER — METFORMIN HCL 500 MG PO TABS
500.0000 mg | ORAL_TABLET | Freq: Two times a day (BID) | ORAL | Status: DC
  Administered 2020-02-13: 500 mg via ORAL
  Filled 2020-02-13 (×2): qty 1

## 2020-02-13 NOTE — Consult Note (Signed)
[name] Kidd  PNT:[id number] DOB: [birth year] DOA: 02/11/2020 PCP: Gorden Harms, PA-C    Brief Narrative:  29 year old with a history of subclinical hypothyroidism, depression, and gestational diabetes who is [redacted] weeks pregnant and presented to [facility] with shortness of breath. She reported generalized body aches and weakness for approximately 1 week leading up to the acute development of shortness of breath.  She also had a significant dry cough.  CXR noted diffuse pulmonary infiltrates consistent with Covid pneumonia.  Oxygen saturations were noted to dip below 94% with ambulation, and she was quite tachypneic.  Significant Events:  10/6 admit to [facility]  Date of Positive COVID Test:  10/6  Vaccination Status: Not vaccinated against Covid  COVID-19 specific Treatment: Remdesivir 10/6 > Steroid 10/6 >  Antimicrobials:  None  DVT prophylaxis: Lovenox  Subjective: Maintaining oxygen saturations at 94-95% on 3 L nasal cannula, and 92% on room air.  Vital signs otherwise stable.  Afebrile.  Inflammatory markers trending downward.  Reports that she is feeling better overall but does complain of ongoing dry cough.  Assessment & Plan:  COVID Pneumonia -acute hypoxic respiratory failure Continue remdesivir and steroid -significant bilateral infiltrates appreciable on CTa chest - no indication for IL6 / JAK inhibitors presently -mobilize -wean oxygen as able - hope to be able to wean to RA prior to d/c home   Recent Labs  Lab 02/11/20 0701 02/11/20 0757 02/12/20 0555 02/13/20 0602  DDIMER  --   --  0.84* 0.71*  FERRITIN  --  90  --  97  CRP  --  10.9* 8.6* 3.4*  ALT 55*  --  74* 120*  PROCALCITON  --  0.42  --   --     Mild transaminitis Likely due to Covid itself with possible contribution from use of remdesivir - if continues to trend upward will need to consider discontinuing remdesivir  Mild hypomagnesemia Being dosed with oral supplementation today  Mild  hypokalemia Being dosed with oral supplementation today  Gestational DM Care per OB  Subclinical hypothyroidism  [redacted] weeks pregnant Care per OB   Code Status: FULL CODE Family Communication: No family present at time of exam  Objective: Blood pressure 109/72, pulse 82, temperature 97.7 F (36.5 C), temperature source Oral, resp. rate 17, height 5\' 4"  (1.626 m), weight 83.9 kg, last menstrual period 07/26/2019, SpO2 97 %, unknown if currently breastfeeding.  Intake/Output Summary (Last 24 hours) at 02/13/2020 1516 Last data filed at 02/12/2020 2031 Gross per 24 hour  Intake 103 ml  Output --  Net 103 ml   Filed Weights   02/11/20 1600  Weight: 83.9 kg    Examination: General: No acute respiratory distress at rest in bed Lungs: Fine scattered crackles bilaterally with no wheezing Cardiovascular: RRR without murmur or rub Abdomen: Soft, BS positive, nontender Extremities: No edema bilateral lower extremities  CBC: Recent Labs  Lab 02/11/20 1611 02/12/20 0555 02/13/20 0602  WBC 5.2 3.5* 4.5  NEUTROABS  --  2.2 2.7  HGB 12.0 11.6* 11.8*  HCT 36.7 36.3 36.8  MCV 85.2 85.2 84.0  PLT 216 228 252   Basic Metabolic Panel: Recent Labs  Lab 02/11/20 0701 02/11/20 0701 02/11/20 1611 02/12/20 0555 02/13/20 0602  NA 132*  --   --  138 138  K 3.9  --   --  3.6 3.3*  CL 107  --   --  111 113*  CO2 12*  --   --  11* 12*  GLUCOSE 80  --   --  95 109*  BUN <5*  --   --  7 10  CREATININE 0.52   < > 0.58 0.52 0.55  CALCIUM 8.0*  --   --  8.8* 8.6*  MG  --   --   --  1.6* 1.6*  PHOS  --   --   --  4.4 4.3   < > = values in this interval not displayed.   GFR: Estimated Creatinine Clearance: 110.7 mL/min (by C-G formula based on SCr of 0.55 mg/dL).  Liver Function Tests: Recent Labs  Lab 02/11/20 0701 02/12/20 0555 02/13/20 0602  AST 104* 126* 190*  ALT 55* 74* 120*  ALKPHOS 90 98 95  BILITOT 1.0 0.6 0.7  PROT 6.2* 6.0* 5.9*  ALBUMIN 2.5* 2.2* 2.1*   Recent  Labs  Lab 02/11/20 0757  LIPASE 51    Recent Results (from the past 240 hour(s))  Resp Panel by RT PCR (RSV, Flu A&B, Covid) - Nasopharyngeal Swab     Status: Abnormal   Collection Time: 02/11/20  3:45 AM   Specimen: Nasopharyngeal Swab  Result Value Ref Range Status   SARS Coronavirus 2 by RT PCR POSITIVE (A) NEGATIVE Final    Comment: RESULT CALLED TO, READ BACK BY AND VERIFIED WITH:  LISA THOMPSON AT 0521 01/12/20 SDR (NOTE) SARS-CoV-2 target nucleic acids are DETECTED.  SARS-CoV-2 RNA is generally detectable in upper respiratory specimens  during the acute phase of infection. Positive results are indicative of the presence of the identified virus, but do not rule out bacterial infection or co-infection with other pathogens not detected by the test. Clinical correlation with patient history and other diagnostic information is necessary to determine patient infection status. The expected result is Negative.  Fact Sheet for Patients:  https://www.moore.com/  Fact Sheet for Healthcare Providers: https://www.young.biz/  This test is not yet approved or cleared by the [place] FDA and  has been authorized for detection and/or diagnosis of SARS-CoV-2 by FDA under an Emergency Use Authorization (EUA).  This EUA will remain in effect (meaning this test can be u sed) for the duration of  the COVID-19 declaration under Section 564(b)(1) of the Act, 21 U.S.C. section 360bbb-3(b)(1), unless the authorization is terminated or revoked sooner.      Influenza A by PCR NEGATIVE NEGATIVE Final   Influenza B by PCR NEGATIVE NEGATIVE Final    Comment: (NOTE) The Xpert Xpress SARS-CoV-2/FLU/RSV assay is intended as an aid in  the diagnosis of influenza from Nasopharyngeal swab specimens and  should not be used as a sole basis for treatment. Nasal washings and  aspirates are unacceptable for Xpert Xpress SARS-CoV-2/FLU/RSV  testing.  Fact Sheet  for Patients: https://www.moore.com/  Fact Sheet for Healthcare Providers: https://www.young.biz/  This test is not yet approved or cleared by the [place] FDA and  has been authorized for detection and/or diagnosis of SARS-CoV-2 by  FDA under an Emergency Use Authorization (EUA). This EUA will remain  in effect (meaning this test can be used) for the duration of the  Covid-19 declaration under Section 564(b)(1) of the Act, 21  U.S.C. section 360bbb-3(b)(1), unless the authorization is  terminated or revoked.    Respiratory Syncytial Virus by PCR NEGATIVE NEGATIVE Final    Comment: (NOTE) Fact Sheet for Patients: https://www.moore.com/  Fact Sheet for Healthcare Providers: https://www.young.biz/  This test is not yet approved or cleared by the [place] FDA and  has been authorized for detection  and/or diagnosis of SARS-CoV-2 by  FDA under an Emergency Use Authorization (EUA). This EUA will remain  in effect (meaning this test can be used) for the duration of the  COVID-19 declaration under Section 564(b)(1) of the Act, 21 U.S.C.  section 360bbb-3(b)(1), unless the authorization is terminated or  revoked. Performed at [facility], [address] [place], [place] [address]   Culture, blood (Routine X 2) w Reflex to ID Panel     Status: None (Preliminary result)   Collection Time: 02/11/20  8:04 AM   Specimen: BLOOD  Result Value Ref Range Status   Specimen Description BLOOD RIGHT ANTECUBITAL  Final   Special Requests   Final    BOTTLES DRAWN AEROBIC AND ANAEROBIC Blood Culture adequate volume   Culture   Final    NO GROWTH 2 DAYS Performed at [facility], [address], [place], [place] [address]    Report Status PENDING  Incomplete  Culture, blood (Routine X 2) w Reflex to ID Panel     Status: None (Preliminary result)   Collection Time: 02/11/20  8:04 AM    Specimen: BLOOD  Result Value Ref Range Status   Specimen Description BLOOD BLOOD LEFT HAND  Final   Special Requests   Final    BOTTLES DRAWN AEROBIC AND ANAEROBIC Blood Culture adequate volume   Culture   Final    NO GROWTH 2 DAYS Performed at [facility], [address] [place], [place] [address]    Report Status PENDING  Incomplete     Scheduled Meds: . docusate sodium  100 mg Oral Daily  . enoxaparin (LOVENOX) injection  40 mg Subcutaneous Q24H  . magnesium chloride  1 tablet Oral BID  . metFORMIN  500 mg Oral BID WC  . methylPREDNISolone (SOLU-MEDROL) injection  0.5 mg/kg Intravenous Q12H   Followed by  . [START ON 02/14/2020] predniSONE  50 mg Oral Daily  . potassium chloride  20 mEq Oral BID  . prenatal multivitamin  1 tablet Oral Q1200  . sodium chloride flush  3 mL Intravenous Q12H   Continuous Infusions: . sodium chloride    . remdesivir 100 mg in NS 100 mL 100 mg (02/13/20 1100)     LOS: 2 days   Lonia Blood, MD [facility] Office  [phone] Pager - Text Page per Amion  If 7PM-7AM, please contact night-coverage per Amion 02/13/2020, 3:16 PM

## 2020-02-13 NOTE — Progress Notes (Signed)
FACULTY PRACTICE ANTEPARTUM PROGRESS NOTE  [name] Kidd is a 29 y.o. G2P1001 at [redacted]w[redacted]d who is admitted for treatment of covid -19 pneumonia.  Estimated Date of Delivery: 04/17/20 Fetal presentation is unsure.  Length of Stay:  2 Days. Admitted 02/11/2020  Subjective: Pt states she is feeling better, but still is mostly concerned about the persistent cough.  She denies fever/chills and notes she is breathing better. Patient reports normal fetal movement.  She denies uterine contractions, denies bleeding and leaking of fluid per vagina.  Vitals:  Blood pressure 119/67, pulse 85, temperature 97.9 F (36.6 C), temperature source Oral, resp. rate (!) 38, height 5\' 4"  (1.626 m), weight 83.9 kg, last menstrual period 07/26/2019, SpO2 95 %, unknown if currently breastfeeding. Physical Examination: CONSTITUTIONAL: Well-developed, well-nourished female in no acute distress.  HENT:  Normocephalic, atraumatic, External right and left ear normal. Oropharynx is clear and moist EYES: Conjunctivae and EOM are normal. Pupils are equal, round, and reactive to light. No scleral icterus.  NECK: Normal range of motion, supple, no masses. SKIN: Skin is warm and dry. No rash noted. Not diaphoretic. No erythema. No pallor. NEUROLGIC: Alert and oriented to person, place, and time. Normal reflexes, muscle tone coordination. No cranial nerve deficit noted. PSYCHIATRIC: Normal mood and affect. Normal behavior. Normal judgment and thought content. CARDIOVASCULAR: Normal heart rate noted, regular rhythm RESPIRATORY: Effort and breath sounds normal, no problems with respiration noted, cough noted MUSCULOSKELETAL: Normal range of motion. No edema and no tenderness. ABDOMEN: Soft, nontender, nondistended, gravid. CERVIX: deferred  Fetal monitoring: pending at time of note writing  Results for orders placed or performed during the hospital encounter of 02/11/20 (from the past 48 hour(s))  CBC     Status:  None   Collection Time: 02/11/20  4:11 PM  Result Value Ref Range   WBC 5.2 4.0 - 10.5 K/uL   RBC 4.31 3.87 - 5.11 MIL/uL   Hemoglobin 12.0 12.0 - 15.0 g/dL   HCT 04/12/20 36 - 46 %   MCV 85.2 80.0 - 100.0 fL   MCH 27.8 26.0 - 34.0 pg   MCHC 32.7 30.0 - 36.0 g/dL   RDW 11.5 72.6 - 20.3 %   Platelets 216 150 - 400 K/uL   nRBC 0.0 0.0 - 0.2 %    Comment: Performed at [facility] Lab, [address], [place], [place]  Creatinine, serum     Status: None   Collection Time: 02/11/20  4:11 PM  Result Value Ref Range   Creatinine, Ser 0.58 0.44 - 1.00 mg/dL   GFR calc non Af Amer >60 >60 mL/min    Comment: Performed at [facility] Lab, [address], [place], [place]  Type and screen [facility]     Status: None   Collection Time: 02/11/20  5:01 PM  Result Value Ref Range   ABO/RH(D) O POS    Antibody Screen NEG    Sample Expiration      02/14/2020,2359 Performed at [facility] Lab, [address] [place], [place]   Comprehensive metabolic panel     Status: Abnormal   Collection Time: 02/12/20  5:55 AM  Result Value Ref Range   Sodium 138 135 - 145 mmol/L   Potassium 3.6 3.5 - 5.1 mmol/L   Chloride 111 98 - 111 mmol/L   CO2 11 (L) 22 - 32 mmol/L   Glucose, Bld 95 70 - 99 mg/dL    Comment:  Glucose reference range applies only to samples taken after fasting for at least 8 hours.   BUN 7 6 - 20 mg/dL   Creatinine, Ser 1.610.52 0.44 - 1.00 mg/dL   Calcium 8.8 (L) 8.9 - 10.3 mg/dL   Total Protein 6.0 (L) 6.5 - 8.1 g/dL   Albumin 2.2 (L) 3.5 - 5.0 g/dL   AST 096126 (H) 15 - 41 U/L   ALT 74 (H) 0 - 44 U/L   Alkaline Phosphatase 98 38 - 126 U/L   Total Bilirubin 0.6 0.3 - 1.2 mg/dL   GFR calc non Af Amer >60 >60 mL/min   Anion gap 16 (H) 5 - 15    Comment: Performed at [facility], [address] [place], [place] [address]  C-reactive protein     Status: Abnormal   Collection Time: 02/12/20  5:55 AM  Result Value Ref Range    CRP 8.6 (H) <1.0 mg/dL    Comment: Performed at [facility], [address] [place], [place] [address]  CBC with Differential/Platelet     Status: Abnormal   Collection Time: 02/12/20  5:55 AM  Result Value Ref Range   WBC 3.5 (L) 4.0 - 10.5 K/uL   RBC 4.26 3.87 - 5.11 MIL/uL   Hemoglobin 11.6 (L) 12.0 - 15.0 g/dL   HCT 14.736.3 36 - 46 %   MCV 85.2 80.0 - 100.0 fL   MCH 27.2 26.0 - 34.0 pg   MCHC 32.0 30.0 - 36.0 g/dL   RDW 82.915.8 (H) 56.211.5 - 13.015.5 %   Platelets 228 150 - 400 K/uL   nRBC 0.0 0.0 - 0.2 %   Neutrophils Relative % 63 %   Neutro Abs 2.2 1.7 - 7.7 K/uL   Lymphocytes Relative 31 %   Lymphs Abs 1.1 0.7 - 4.0 K/uL   Monocytes Relative 5 %   Monocytes Absolute 0.2 0.1 - 1.0 K/uL   Eosinophils Relative 0 %   Eosinophils Absolute 0.0 0 - 0 K/uL   Basophils Relative 0 %   Basophils Absolute 0.0 0 - 0 K/uL   Immature Granulocytes 1 %   Abs Immature Granulocytes 0.03 0.00 - 0.07 K/uL    Comment: Performed at [facility], [address] [place], [place] [address]  D-dimer, quantitative (not at [facility])     Status: Abnormal   Collection Time: 02/12/20  5:55 AM  Result Value Ref Range   D-Dimer, Quant 0.84 (H) 0.00 - 0.50 ug/mL-FEU    Comment: (NOTE) At the manufacturer cut-off of 0.50 ug/mL FEU, this assay has been documented to exclude PE with a sensitivity and negative predictive value of 97 to 99%.  At this time, this assay has not been approved by the FDA to exclude DVT/VTE. Results should be correlated with clinical presentation. Performed at [facility], [address] [place], [place] [address]   Magnesium     Status: Abnormal   Collection Time: 02/12/20  5:55 AM  Result Value Ref Range   Magnesium 1.6 (L) 1.7 - 2.4 mg/dL    Comment: Performed at [facility], [address] [place], [place] [address]  Phosphorus     Status: None   Collection Time: 02/12/20  5:55 AM  Result Value Ref Range   Phosphorus 4.4 2.5 - 4.6 mg/dL    Comment:  Performed at [facility], [address] [place], [place] [address]  Glucose, capillary     Status:  Abnormal   Collection Time: 02/12/20 11:35 AM  Result Value Ref Range   Glucose-Capillary 149 (H) 70 - 99 mg/dL    Comment: Glucose reference range applies only to samples taken after fasting for at least 8 hours.  Glucose, capillary     Status: Abnormal   Collection Time: 02/12/20  4:44 PM  Result Value Ref Range   Glucose-Capillary 159 (H) 70 - 99 mg/dL    Comment: Glucose reference range applies only to samples taken after fasting for at least 8 hours.  Glucose, capillary     Status: Abnormal   Collection Time: 02/12/20 10:07 PM  Result Value Ref Range   Glucose-Capillary 111 (H) 70 - 99 mg/dL    Comment: Glucose reference range applies only to samples taken after fasting for at least 8 hours.  Comprehensive metabolic panel     Status: Abnormal   Collection Time: 02/13/20  6:02 AM  Result Value Ref Range   Sodium 138 135 - 145 mmol/L   Potassium 3.3 (L) 3.5 - 5.1 mmol/L   Chloride 113 (H) 98 - 111 mmol/L   CO2 12 (L) 22 - 32 mmol/L   Glucose, Bld 109 (H) 70 - 99 mg/dL    Comment: Glucose reference range applies only to samples taken after fasting for at least 8 hours.   BUN 10 6 - 20 mg/dL   Creatinine, Ser 8.26 0.44 - 1.00 mg/dL   Calcium 8.6 (L) 8.9 - 10.3 mg/dL   Total Protein 5.9 (L) 6.5 - 8.1 g/dL   Albumin 2.1 (L) 3.5 - 5.0 g/dL   AST 415 (H) 15 - 41 U/L   ALT 120 (H) 0 - 44 U/L   Alkaline Phosphatase 95 38 - 126 U/L   Total Bilirubin 0.7 0.3 - 1.2 mg/dL   GFR calc non Af Amer >60 >60 mL/min   Anion gap 13 5 - 15    Comment: Performed at [facility] Lab, [address], [place], [place] [address]  C-reactive protein     Status: Abnormal   Collection Time: 02/13/20  6:02 AM  Result Value Ref Range   CRP 3.4 (H) <1.0 mg/dL    Comment: Performed at [facility] Lab, [address], [place], [place] [address]  CBC with Differential/Platelet     Status:  Abnormal   Collection Time: 02/13/20  6:02 AM  Result Value Ref Range   WBC 4.5 4.0 - 10.5 K/uL   RBC 4.38 3.87 - 5.11 MIL/uL   Hemoglobin 11.8 (L) 12.0 - 15.0 g/dL   HCT 88.1 36 - 46 %   MCV 84.0 80.0 - 100.0 fL   MCH 26.9 26.0 - 34.0 pg   MCHC 32.1 30.0 - 36.0 g/dL   RDW 10.3 (H) 15.9 - 45.8 %   Platelets 252 150 - 400 K/uL   nRBC 0.4 (H) 0.0 - 0.2 %   Neutrophils Relative % 60 %   Neutro Abs 2.7 1.7 - 7.7 K/uL   Lymphocytes Relative 33 %   Lymphs Abs 1.5 0.7 - 4.0 K/uL   Monocytes Relative 5 %   Monocytes Absolute 0.2 0.1 - 1.0 K/uL   Eosinophils Relative 0 %   Eosinophils Absolute 0.0 0 - 0 K/uL   Basophils Relative 0 %   Basophils Absolute 0.0 0 - 0 K/uL   Immature Granulocytes 2 %   Abs Immature Granulocytes 0.07 0.00 - 0.07 K/uL   Polychromasia PRESENT     Comment: Performed at [facility], [address] [place], [place] [address]  D-dimer, quantitative (not at [facility])     Status: Abnormal   Collection Time: 02/13/20  6:02 AM  Result Value Ref Range   D-Dimer, Quant 0.71 (H) 0.00 - 0.50 ug/mL-FEU    Comment: (NOTE) At the manufacturer cut-off value of 0.5 g/mL FEU, this assay has a negative predictive value of 95-100%.This assay is intended for use in conjunction with a clinical pretest probability (PTP) assessment model to exclude pulmonary embolism (PE) and deep venous thrombosis (DVT) in outpatients suspected of PE or DVT. Results should be correlated with clinical presentation. Performed at [facility] Lab, [address] [place], [place] [address]   Magnesium     Status: Abnormal   Collection Time: 02/13/20  6:02 AM  Result Value Ref Range   Magnesium 1.6 (L) 1.7 - 2.4 mg/dL    Comment: Performed at [facility] Lab, [address], [place], [place] [address]  Phosphorus     Status: None   Collection Time: 02/13/20  6:02 AM  Result Value Ref Range   Phosphorus 4.3 2.5 - 4.6 mg/dL    Comment: Performed at [facility], [address] [place], [place] [address]  Ferritin     Status: None   Collection Time: 02/13/20  6:02 AM  Result Value Ref Range   Ferritin 97 11 - 307 ng/mL    Comment: Performed at [facility] Lab, [address] [place], [place] [address]  Glucose, capillary     Status: Abnormal   Collection Time: 02/13/20  9:21 AM  Result Value Ref Range   Glucose-Capillary 103 (H) 70 - 99 mg/dL    Comment: Glucose reference range applies only to samples taken after fasting for at least 8 hours.  Glucose, capillary     Status: Abnormal   Collection Time: 02/13/20 11:13 AM  Result Value Ref Range   Glucose-Capillary 138 (H) 70 - 99 mg/dL    Comment: Glucose reference range applies only to samples taken after fasting for at least 8 hours.    I have reviewed the patient's current medications.  ASSESSMENT: Active Problems:   Pneumonia due to COVID-19 virus   Hypothyroidism   [redacted] weeks gestation of pregnancy   Diet controlled gestational diabetes mellitus (GDM) in third trimester   PLAN: Covid 19 pneumonia:  Treatment per medicine team.  Appreciate assistance  30 week pregnancy:  Continue daily NST  Gestational diabetes:  Blood sugars are slightly elevated with fasting and postprandials, will add metformin for treatment/control  Hypomagnesium:  1.6,  will replace orally   Continue routine antenatal care.   Mariel Aloe, MD [facility] Faculty Attending, Center for [facility] 02/13/2020 12:03 PM

## 2020-02-14 DIAGNOSIS — J1282 Pneumonia due to coronavirus disease 2019: Secondary | ICD-10-CM

## 2020-02-14 DIAGNOSIS — Z3A31 31 weeks gestation of pregnancy: Secondary | ICD-10-CM

## 2020-02-14 DIAGNOSIS — U071 COVID-19: Secondary | ICD-10-CM

## 2020-02-14 DIAGNOSIS — O98513 Other viral diseases complicating pregnancy, third trimester: Secondary | ICD-10-CM

## 2020-02-14 DIAGNOSIS — O24410 Gestational diabetes mellitus in pregnancy, diet controlled: Secondary | ICD-10-CM

## 2020-02-14 LAB — COMPREHENSIVE METABOLIC PANEL
ALT: 131 U/L — ABNORMAL HIGH (ref 0–44)
AST: 173 U/L — ABNORMAL HIGH (ref 15–41)
Albumin: 2.1 g/dL — ABNORMAL LOW (ref 3.5–5.0)
Alkaline Phosphatase: 93 U/L (ref 38–126)
Anion gap: 12 (ref 5–15)
BUN: 9 mg/dL (ref 6–20)
CO2: 14 mmol/L — ABNORMAL LOW (ref 22–32)
Calcium: 8.4 mg/dL — ABNORMAL LOW (ref 8.9–10.3)
Chloride: 112 mmol/L — ABNORMAL HIGH (ref 98–111)
Creatinine, Ser: 0.52 mg/dL (ref 0.44–1.00)
GFR, Estimated: >60 mL/min (ref >60)
Glucose, Bld: 114 mg/dL — ABNORMAL HIGH (ref 70–99)
Potassium: 3.7 mmol/L (ref 3.5–5.1)
Sodium: 138 mmol/L (ref 135–145)
Total Bilirubin: 0.2 mg/dL — ABNORMAL LOW (ref 0.3–1.2)
Total Protein: 6 g/dL — ABNORMAL LOW (ref 6.5–8.1)

## 2020-02-14 LAB — CBC WITH DIFFERENTIAL/PLATELET
Abs Immature Granulocytes: 0.13 10*3/uL — ABNORMAL HIGH (ref 0.00–0.07)
Basophils Absolute: 0 10*3/uL (ref 0.0–0.1)
Basophils Relative: 0 %
Eosinophils Absolute: 0 10*3/uL (ref 0.0–0.5)
Eosinophils Relative: 0 %
HCT: 38 % (ref 36.0–46.0)
Hemoglobin: 12.2 g/dL (ref 12.0–15.0)
Immature Granulocytes: 3 %
Lymphocytes Relative: 31 %
Lymphs Abs: 1.6 10*3/uL (ref 0.7–4.0)
MCH: 27 pg (ref 26.0–34.0)
MCHC: 32.1 g/dL (ref 30.0–36.0)
MCV: 84.1 fL (ref 80.0–100.0)
Monocytes Absolute: 0.2 10*3/uL (ref 0.1–1.0)
Monocytes Relative: 4 %
Neutro Abs: 3.3 10*3/uL (ref 1.7–7.7)
Neutrophils Relative %: 62 %
Platelets: 289 10*3/uL (ref 150–400)
RBC: 4.52 MIL/uL (ref 3.87–5.11)
RDW: 15.7 % — ABNORMAL HIGH (ref 11.5–15.5)
WBC: 5.2 10*3/uL (ref 4.0–10.5)
nRBC: 1.3 % — ABNORMAL HIGH (ref 0.0–0.2)

## 2020-02-14 LAB — MAGNESIUM: Magnesium: 1.8 mg/dL (ref 1.7–2.4)

## 2020-02-14 LAB — FERRITIN: Ferritin: 71 ng/mL (ref 11–307)

## 2020-02-14 LAB — GLUCOSE, CAPILLARY
Glucose-Capillary: 104 mg/dL — ABNORMAL HIGH (ref 70–99)
Glucose-Capillary: 155 mg/dL — ABNORMAL HIGH (ref 70–99)
Glucose-Capillary: 101 mg/dL — ABNORMAL HIGH (ref 70–99)
Glucose-Capillary: 136 mg/dL — ABNORMAL HIGH (ref 70–99)

## 2020-02-14 LAB — D-DIMER, QUANTITATIVE: D-Dimer, Quant: 0.87 ug/mL-FEU — ABNORMAL HIGH (ref 0.00–0.50)

## 2020-02-14 LAB — C-REACTIVE PROTEIN: CRP: 1.3 mg/dL — ABNORMAL HIGH (ref <1.0)

## 2020-02-14 MED ORDER — METFORMIN HCL 500 MG PO TABS
1000.0000 mg | ORAL_TABLET | Freq: Two times a day (BID) | ORAL | Status: DC
  Administered 2020-02-14 – 2020-02-15 (×4): 1000 mg via ORAL
  Filled 2020-02-14 (×4): qty 2

## 2020-02-14 MED ORDER — INSULIN ASPART 100 UNIT/ML ~~LOC~~ SOLN
0.0000 [IU] | Freq: Three times a day (TID) | SUBCUTANEOUS | Status: DC
  Administered 2020-02-14: 2 [IU] via SUBCUTANEOUS
  Administered 2020-02-14 – 2020-02-15 (×2): 3 [IU] via SUBCUTANEOUS

## 2020-02-14 MED ORDER — INSULIN ASPART 100 UNIT/ML ~~LOC~~ SOLN: 0.0000 [IU] | Freq: Three times a day (TID) | SUBCUTANEOUS | Status: DC

## 2020-02-14 NOTE — Progress Notes (Signed)
Patient ID: [name] Kidd, female   DOB: [birth year], 29 y.o.   MRN: [id number] FACULTY PRACTICE ANTEPARTUM PROGRESS NOTE  [name] Kidd is a 29 y.o. G2P1001 at [redacted]w[redacted]d who is admitted for treatment of covid -19 pneumonia.  Estimated Date of Delivery: 04/17/20 Fetal presentation is unsure.  Length of Stay:  3 Days. Admitted 02/11/2020  Subjective: Pt states she is feeling better but with persistent cough which ia worst with exertion. The cough is most bothersome to her. She states that overall she is feeling better. Patient reports normal fetal movement.  She denies uterine contractions, denies bleeding and leaking of fluid per vagina.  Vitals:  Blood pressure 107/68, pulse 90, temperature 97.9 F (36.6 C), temperature source Oral, resp. rate 20, height 5\' 4"  (1.626 m), weight 83.9 kg, last menstrual period 07/26/2019, SpO2 94 %, unknown if currently breastfeeding. Physical Examination: CONSTITUTIONAL: Well-developed, well-nourished female in no acute distress.  NEUROLGIC: Alert and oriented to person, place, and time. Normal reflexes, muscle tone coordination. No cranial nerve deficit noted. CARDIOVASCULAR: Normal heart rate noted, regular rhythm RESPIRATORY: Effort and breath sounds normal, no problems with respiration noted, non productive cough noted MUSCULOSKELETAL: Normal range of motion. No edema and no tenderness. ABDOMEN: Soft, nontender, nondistended, gravid. CERVIX: deferred  Fetal monitoring: baseline 140, mod variability, +accels, no decels No contractions  Results for orders placed or performed during the hospital encounter of 02/11/20 (from the past 48 hour(s))  Glucose, capillary     Status: Abnormal   Collection Time: 02/12/20 11:35 AM  Result Value Ref Range   Glucose-Capillary 149 (H) 70 - 99 mg/dL    Comment: Glucose reference range applies only to samples taken after fasting for at least 8 hours.  Glucose, capillary     Status: Abnormal    Collection Time: 02/12/20  4:44 PM  Result Value Ref Range   Glucose-Capillary 159 (H) 70 - 99 mg/dL    Comment: Glucose reference range applies only to samples taken after fasting for at least 8 hours.  Glucose, capillary     Status: Abnormal   Collection Time: 02/12/20 10:07 PM  Result Value Ref Range   Glucose-Capillary 111 (H) 70 - 99 mg/dL    Comment: Glucose reference range applies only to samples taken after fasting for at least 8 hours.  Comprehensive metabolic panel     Status: Abnormal   Collection Time: 02/13/20  6:02 AM  Result Value Ref Range   Sodium 138 135 - 145 mmol/L   Potassium 3.3 (L) 3.5 - 5.1 mmol/L   Chloride 113 (H) 98 - 111 mmol/L   CO2 12 (L) 22 - 32 mmol/L   Glucose, Bld 109 (H) 70 - 99 mg/dL    Comment: Glucose reference range applies only to samples taken after fasting for at least 8 hours.   BUN 10 6 - 20 mg/dL   Creatinine, Ser 04/14/20 0.44 - 1.00 mg/dL   Calcium 8.6 (L) 8.9 - 10.3 mg/dL   Total Protein 5.9 (L) 6.5 - 8.1 g/dL   Albumin 2.1 (L) 3.5 - 5.0 g/dL   AST 1.94 (H) 15 - 41 U/L   ALT 120 (H) 0 - 44 U/L   Alkaline Phosphatase 95 38 - 126 U/L   Total Bilirubin 0.7 0.3 - 1.2 mg/dL   GFR calc non Af Amer >60 >60 mL/min   Anion gap 13 5 - 15    Comment: Performed at [facility] Lab, [address], [place], [place]  [id number]  C-reactive protein     Status: Abnormal   Collection Time: 02/13/20  6:02 AM  Result Value Ref Range   CRP 3.4 (H) <1.0 mg/dL    Comment: Performed at [facility], [address] [place], [place] [address]  CBC with Differential/Platelet     Status: Abnormal   Collection Time: 02/13/20  6:02 AM  Result Value Ref Range   WBC 4.5 4.0 - 10.5 K/uL   RBC 4.38 3.87 - 5.11 MIL/uL   Hemoglobin 11.8 (L) 12.0 - 15.0 g/dL   HCT 09.836.8 36 - 46 %   MCV 84.0 80.0 - 100.0 fL   MCH 26.9 26.0 - 34.0 pg   MCHC 32.1 30.0 - 36.0 g/dL   RDW 11.915.7 (H) 14.711.5 - 82.915.5 %   Platelets 252 150 - 400 K/uL   nRBC 0.4 (H) 0.0 - 0.2 %    Neutrophils Relative % 60 %   Neutro Abs 2.7 1.7 - 7.7 K/uL   Lymphocytes Relative 33 %   Lymphs Abs 1.5 0.7 - 4.0 K/uL   Monocytes Relative 5 %   Monocytes Absolute 0.2 0.1 - 1.0 K/uL   Eosinophils Relative 0 %   Eosinophils Absolute 0.0 0 - 0 K/uL   Basophils Relative 0 %   Basophils Absolute 0.0 0 - 0 K/uL   Immature Granulocytes 2 %   Abs Immature Granulocytes 0.07 0.00 - 0.07 K/uL   Polychromasia PRESENT     Comment: Performed at [facility], [address] [place], [place] [id number]  D-dimer, quantitative (not at [facility])     Status: Abnormal   Collection Time: 02/13/20  6:02 AM  Result Value Ref Range   D-Dimer, Quant 0.71 (H) 0.00 - 0.50 ug/mL-FEU    Comment: (NOTE) At the manufacturer cut-off value of 0.5 g/mL FEU, this assay has a negative predictive value of 95-100%.This assay is intended for use in conjunction with a clinical pretest probability (PTP) assessment model to exclude pulmonary embolism (PE) and deep venous thrombosis (DVT) in outpatients suspected of PE or DVT. Results should be correlated with clinical presentation. Performed at [facility], [address] [place], [place] [address]   Magnesium     Status: Abnormal   Collection Time: 02/13/20  6:02 AM  Result Value Ref Range   Magnesium 1.6 (L) 1.7 - 2.4 mg/dL    Comment: Performed at [facility], [address] [place], [place] [address]  Phosphorus     Status: None   Collection Time: 02/13/20  6:02 AM  Result Value Ref Range   Phosphorus 4.3 2.5 - 4.6 mg/dL    Comment: Performed at [facility], [address] [place], [place] [address]  Ferritin     Status: None   Collection Time: 02/13/20  6:02 AM  Result Value Ref Range   Ferritin 97 11 - 307 ng/mL    Comment: Performed at [facility]  Lab, [address] [place], [place] [address]  Glucose, capillary     Status: Abnormal   Collection Time: 02/13/20  9:21 AM  Result Value Ref Range   Glucose-Capillary 103 (H)  70 - 99 mg/dL    Comment: Glucose reference range applies only to samples taken after fasting for at least 8 hours.  Glucose, capillary     Status: Abnormal   Collection Time: 02/13/20 11:13 AM  Result Value Ref Range   Glucose-Capillary 138 (H) 70 - 99 mg/dL  Comment: Glucose reference range applies only to samples taken after fasting for at least 8 hours.  Glucose, capillary     Status: Abnormal   Collection Time: 02/13/20  3:06 PM  Result Value Ref Range   Glucose-Capillary 127 (H) 70 - 99 mg/dL    Comment: Glucose reference range applies only to samples taken after fasting for at least 8 hours.  Glucose, capillary     Status: Abnormal   Collection Time: 02/13/20  9:29 PM  Result Value Ref Range   Glucose-Capillary 122 (H) 70 - 99 mg/dL    Comment: Glucose reference range applies only to samples taken after fasting for at least 8 hours.  Comprehensive metabolic panel     Status: Abnormal   Collection Time: 02/14/20  5:02 AM  Result Value Ref Range   Sodium 138 135 - 145 mmol/L   Potassium 3.7 3.5 - 5.1 mmol/L   Chloride 112 (H) 98 - 111 mmol/L   CO2 14 (L) 22 - 32 mmol/L   Glucose, Bld 114 (H) 70 - 99 mg/dL    Comment: Glucose reference range applies only to samples taken after fasting for at least 8 hours.   BUN 9 6 - 20 mg/dL   Creatinine, Ser 4.16 0.44 - 1.00 mg/dL   Calcium 8.4 (L) 8.9 - 10.3 mg/dL   Total Protein 6.0 (L) 6.5 - 8.1 g/dL   Albumin 2.1 (L) 3.5 - 5.0 g/dL   AST 384 (H) 15 - 41 U/L   ALT 131 (H) 0 - 44 U/L   Alkaline Phosphatase 93 38 - 126 U/L   Total Bilirubin 0.2 (L) 0.3 - 1.2 mg/dL   GFR, Estimated >53 >64 mL/min   Anion gap 12 5 - 15    Comment: Performed at [facility] Lab, [address] [place], [place] [address]  C-reactive protein     Status: Abnormal   Collection Time: 02/14/20  5:02 AM  Result Value Ref Range   CRP 1.3 (H) <1.0 mg/dL    Comment: Performed at [facility] Lab, [address], [place], [place] [address]  CBC with  Differential/Platelet     Status: Abnormal   Collection Time: 02/14/20  5:02 AM  Result Value Ref Range   WBC 5.2 4.0 - 10.5 K/uL   RBC 4.52 3.87 - 5.11 MIL/uL   Hemoglobin 12.2 12.0 - 15.0 g/dL   HCT 25.0 36 - 46 %   MCV 84.1 80.0 - 100.0 fL   MCH 27.0 26.0 - 34.0 pg   MCHC 32.1 30.0 - 36.0 g/dL   RDW 03.7 (H) 04.8 - 88.9 %   Platelets 289 150 - 400 K/uL   nRBC 1.3 (H) 0.0 - 0.2 %   Neutrophils Relative % 62 %   Neutro Abs 3.3 1.7 - 7.7 K/uL   Lymphocytes Relative 31 %   Lymphs Abs 1.6 0.7 - 4.0 K/uL   Monocytes Relative 4 %   Monocytes Absolute 0.2 0.1 - 1.0 K/uL   Eosinophils Relative 0 %   Eosinophils Absolute 0.0 0 - 0 K/uL   Basophils Relative 0 %   Basophils Absolute 0.0 0 - 0 K/uL   Immature Granulocytes 3 %   Abs Immature Granulocytes 0.13 (H) 0.00 - 0.07 K/uL   Smudge Cells PRESENT     Comment: Performed at [facility] Lab, [address], [place], [place] [address]  D-dimer, quantitative (not at [facility])     Status: Abnormal   Collection Time:  02/14/20  5:02 AM  Result Value Ref Range   D-Dimer, Quant 0.87 (H) 0.00 - 0.50 ug/mL-FEU    Comment: (NOTE) At the manufacturer cut-off value of 0.5 g/mL FEU, this assay has a negative predictive value of 95-100%.This assay is intended for use in conjunction with a clinical pretest probability (PTP) assessment model to exclude pulmonary embolism (PE) and deep venous thrombosis (DVT) in outpatients suspected of PE or DVT. Results should be correlated with clinical presentation. Performed at [facility] Lab, [address] [place], [place] [address]   Magnesium     Status: None   Collection Time: 02/14/20  5:02 AM  Result Value Ref Range   Magnesium 1.8 1.7 - 2.4 mg/dL    Comment: Performed at [facility] Lab, [address], [place], [place] [address]  Ferritin     Status: None   Collection Time: 02/14/20  5:02 AM  Result Value Ref Range   Ferritin 71 11 - 307 ng/mL    Comment: Performed at [facility], [address], [place], [place] [address]  Glucose, capillary     Status: Abnormal   Collection Time: 02/14/20  5:57 AM  Result Value Ref Range   Glucose-Capillary 104 (H) 70 - 99 mg/dL    Comment: Glucose reference range applies only to samples taken after fasting for at least 8 hours.    I have reviewed the patient's current medications.  ASSESSMENT: Active Problems:   Pneumonia due to COVID-19 virus   Hypothyroidism   [redacted] weeks gestation of pregnancy   Diet controlled gestational diabetes mellitus (GDM) in third trimester   PLAN: - Covid 19 pneumonia:  Continue inpatient management per medicine team.   - 30 week pregnancy:  Continue daily NST  - Gestational diabetes:   Persistently elevated CBG fasting and postprandial, likely secondary to steroid Will increase metformin to 1000 mg BID and add sliding scale   Continue routine antenatal care.   Catalina Antigua, MD [facility] Faculty Attending, Center for [facility] 02/14/2020 9:25 AM

## 2020-02-14 NOTE — Consult Note (Signed)
[name] Kidd  JEH:[id number] DOB: 07/01/1990 DOA: 02/11/2020 PCP: Gorden Harms, PA-C    Brief Narrative:  29yo with a history of subclinical hypothyroidism, depression, and gestational diabetes who is [redacted] weeks pregnant and presented to [facility] with shortness of breath. She reported generalized body aches and weakness for approximately 1 week leading up to the acute development of shortness of breath.  She also had a significant dry cough.  CXR noted diffuse pulmonary infiltrates consistent with Covid pneumonia.  Oxygen saturations were noted to dip below 94% with ambulation, and she was quite tachypneic.  Significant Events:  10/6 admit to [facility]  Date of Positive COVID Test:  10/6  Vaccination Status: Not vaccinated against Covid  COVID-19 specific Treatment: Remdesivir 10/6 > Steroid 10/6 >  Antimicrobials:  None  DVT prophylaxis: Lovenox  Subjective: Vital signs are stable.  Saturations 92-94% on room air only.  No new complaints.  Anticipate patient will be able to be discharged home following her fifth dose Remdesivir 10/10 as long as her saturations are stable with ambulation.  Assessment & Plan:  COVID Pneumonia -acute hypoxic respiratory failure Continue remdesivir and steroid - significant bilateral infiltrates appreciable on CTa chest - no indication for IL6 / JAK inhibitors presently -mobilize -wean oxygen as able -need to have her ambulatory oxygen saturations assessed prior to determining if she is safe for discharge home -anticipate discharge home 10/10 after fifth dose of remdesivir if saturations 88% or greater when ambulating on room air  Recent Labs  Lab 02/11/20 0701 02/11/20 0757 02/12/20 0555 02/13/20 0602 02/14/20 0502  DDIMER  --   --  0.84* 0.71* 0.87*  FERRITIN  --  90  --  97 71  CRP  --  10.9* 8.6* 3.4* 1.3*  ALT 55*  --  74* 120* 131*  PROCALCITON  --  0.42  --   --   --     Mild transaminitis Likely due to Covid itself with  possible contribution from use of remdesivir - not yet at point to prohibit ongoing use of remdesivir   Mild hypomagnesemia Corrected with supplementation  Mild hypokalemia Corrected with supplementation  Gestational DM Care per OB -CBG control  Subclinical hypothyroidism  [redacted] weeks pregnant Care per OB   Code Status: FULL CODE Family Communication: No family present at time of exam  Objective: Blood pressure 107/68, pulse 90, temperature 97.9 F (36.6 C), temperature source Oral, resp. rate 20, height 5\' 4"  (1.626 m), weight 83.9 kg, last menstrual period 07/26/2019, SpO2 94 %, unknown if currently breastfeeding. No intake or output data in the 24 hours ending 02/14/20 0726 Filed Weights   02/11/20 1600  Weight: 83.9 kg    Examination: General: No acute respiratory distress -breathing comfortably on room air Lungs: Fine crackles with no wheezing Cardiovascular: RRR Abdomen: Soft, BS positive, nontender Extremities: No edema B LE  CBC: Recent Labs  Lab 02/12/20 0555 02/13/20 0602 02/14/20 0502  WBC 3.5* 4.5 5.2  NEUTROABS 2.2 2.7 3.3  HGB 11.6* 11.8* 12.2  HCT 36.3 36.8 38.0  MCV 85.2 84.0 84.1  PLT 228 252 289   Basic Metabolic Panel: Recent Labs  Lab 02/12/20 0555 02/13/20 0602 02/14/20 0502  NA 138 138 138  K 3.6 3.3* 3.7  CL 111 113* 112*  CO2 11* 12* 14*  GLUCOSE 95 109* 114*  BUN 7 10 9   CREATININE 0.52 0.55 0.52  CALCIUM 8.8* 8.6* 8.4*  MG 1.6* 1.6* 1.8  PHOS 4.4 4.3  --  GFR: Estimated Creatinine Clearance: 110.7 mL/min (by C-G formula based on SCr of 0.52 mg/dL).  Liver Function Tests: Recent Labs  Lab 02/11/20 0701 02/12/20 0555 02/13/20 0602 02/14/20 0502  AST 104* 126* 190* 173*  ALT 55* 74* 120* 131*  ALKPHOS 90 98 95 93  BILITOT 1.0 0.6 0.7 0.2*  PROT 6.2* 6.0* 5.9* 6.0*  ALBUMIN 2.5* 2.2* 2.1* 2.1*   Recent Labs  Lab 02/11/20 0757  LIPASE 51    Recent Results (from the past 240 hour(s))  Resp Panel by RT PCR  (RSV, Flu A&B, Covid) - Nasopharyngeal Swab     Status: Abnormal   Collection Time: 02/11/20  3:45 AM   Specimen: Nasopharyngeal Swab  Result Value Ref Range Status   SARS Coronavirus 2 by RT PCR POSITIVE (A) NEGATIVE Final    Comment: RESULT CALLED TO, READ BACK BY AND VERIFIED WITH:  LISA THOMPSON AT 0521 01/12/20 SDR (NOTE) SARS-CoV-2 target nucleic acids are DETECTED.  SARS-CoV-2 RNA is generally detectable in upper respiratory specimens  during the acute phase of infection. Positive results are indicative of the presence of the identified virus, but do not rule out bacterial infection or co-infection with other pathogens not detected by the test. Clinical correlation with patient history and other diagnostic information is necessary to determine patient infection status. The expected result is Negative.  Fact Sheet for Patients:  https://www.moore.com/  Fact Sheet for Healthcare Providers: https://www.young.biz/  This test is not yet approved or cleared by the [place] FDA and  has been authorized for detection and/or diagnosis of SARS-CoV-2 by FDA under an Emergency Use Authorization (EUA).  This EUA will remain in effect (meaning this test can be u sed) for the duration of  the COVID-19 declaration under Section 564(b)(1) of the Act, 21 U.S.C. section 360bbb-3(b)(1), unless the authorization is terminated or revoked sooner.      Influenza A by PCR NEGATIVE NEGATIVE Final   Influenza B by PCR NEGATIVE NEGATIVE Final    Comment: (NOTE) The Xpert Xpress SARS-CoV-2/FLU/RSV assay is intended as an aid in  the diagnosis of influenza from Nasopharyngeal swab specimens and  should not be used as a sole basis for treatment. Nasal washings and  aspirates are unacceptable for Xpert Xpress SARS-CoV-2/FLU/RSV  testing.  Fact Sheet for Patients: https://www.moore.com/  Fact Sheet for Healthcare  Providers: https://www.young.biz/  This test is not yet approved or cleared by the [place] FDA and  has been authorized for detection and/or diagnosis of SARS-CoV-2 by  FDA under an Emergency Use Authorization (EUA). This EUA will remain  in effect (meaning this test can be used) for the duration of the  Covid-19 declaration under Section 564(b)(1) of the Act, 21  U.S.C. section 360bbb-3(b)(1), unless the authorization is  terminated or revoked.    Respiratory Syncytial Virus by PCR NEGATIVE NEGATIVE Final    Comment: (NOTE) Fact Sheet for Patients: https://www.moore.com/  Fact Sheet for Healthcare Providers: https://www.young.biz/  This test is not yet approved or cleared by the [place] FDA and  has been authorized for detection and/or diagnosis of SARS-CoV-2 by  FDA under an Emergency Use Authorization (EUA). This EUA will remain  in effect (meaning this test can be used) for the duration of the  COVID-19 declaration under Section 564(b)(1) of the Act, 21 U.S.C.  section 360bbb-3(b)(1), unless the authorization is terminated or  revoked. Performed at [facility], [address], [place], [place] [address]   Culture, blood (Routine X 2) w  Reflex to ID Panel     Status: None (Preliminary result)   Collection Time: 02/11/20  8:04 AM   Specimen: BLOOD  Result Value Ref Range Status   Specimen Description BLOOD RIGHT ANTECUBITAL  Final   Special Requests   Final    BOTTLES DRAWN AEROBIC AND ANAEROBIC Blood Culture adequate volume   Culture   Final    NO GROWTH 2 DAYS Performed at [facility], [address], [place], [place] [address]    Report Status PENDING  Incomplete  Culture, blood (Routine X 2) w Reflex to ID Panel     Status: None (Preliminary result)   Collection Time: 02/11/20  8:04 AM   Specimen: BLOOD  Result Value Ref Range Status   Specimen Description BLOOD BLOOD LEFT  HAND  Final   Special Requests   Final    BOTTLES DRAWN AEROBIC AND ANAEROBIC Blood Culture adequate volume   Culture   Final    NO GROWTH 2 DAYS Performed at [facility], [address], [place], [place] [address]    Report Status PENDING  Incomplete     Scheduled Meds: . docusate sodium  100 mg Oral Daily  . enoxaparin (LOVENOX) injection  40 mg Subcutaneous Q24H  . metFORMIN  500 mg Oral BID WC  . predniSONE  50 mg Oral Daily  . prenatal multivitamin  1 tablet Oral Q1200  . sodium chloride flush  3 mL Intravenous Q12H   Continuous Infusions: . sodium chloride    . remdesivir 100 mg in NS 100 mL 100 mg (02/13/20 1100)     LOS: 3 days   Lonia Blood, MD [facility] Office  [phone] Pager - Text Page per Amion  If 7PM-7AM, please contact night-coverage per Amion 02/14/2020, 7:26 AM

## 2020-02-15 ENCOUNTER — Encounter (HOSPITAL_COMMUNITY): Payer: Self-pay | Admitting: Obstetrics and Gynecology

## 2020-02-15 LAB — COMPREHENSIVE METABOLIC PANEL
ALT: 155 U/L — ABNORMAL HIGH (ref 0–44)
AST: 159 U/L — ABNORMAL HIGH (ref 15–41)
Albumin: 2.1 g/dL — ABNORMAL LOW (ref 3.5–5.0)
Alkaline Phosphatase: 99 U/L (ref 38–126)
Anion gap: 14 (ref 5–15)
BUN: 9 mg/dL (ref 6–20)
CO2: 15 mmol/L — ABNORMAL LOW (ref 22–32)
Calcium: 8.5 mg/dL — ABNORMAL LOW (ref 8.9–10.3)
Chloride: 108 mmol/L (ref 98–111)
Creatinine, Ser: 0.6 mg/dL (ref 0.44–1.00)
GFR, Estimated: >60 mL/min (ref >60)
Glucose, Bld: 112 mg/dL — ABNORMAL HIGH (ref 70–99)
Potassium: 3.5 mmol/L (ref 3.5–5.1)
Sodium: 137 mmol/L (ref 135–145)
Total Bilirubin: 0.3 mg/dL (ref 0.3–1.2)
Total Protein: 6.1 g/dL — ABNORMAL LOW (ref 6.5–8.1)

## 2020-02-15 LAB — GLUCOSE, CAPILLARY
Glucose-Capillary: 105 mg/dL — ABNORMAL HIGH (ref 70–99)
Glucose-Capillary: 118 mg/dL — ABNORMAL HIGH (ref 70–99)
Glucose-Capillary: 161 mg/dL — ABNORMAL HIGH (ref 70–99)

## 2020-02-15 LAB — CBC WITH DIFFERENTIAL/PLATELET
Abs Immature Granulocytes: 0.1 10*3/uL — ABNORMAL HIGH (ref 0.00–0.07)
Basophils Absolute: 0 10*3/uL (ref 0.0–0.1)
Basophils Relative: 0 %
Eosinophils Absolute: 0 10*3/uL (ref 0.0–0.5)
Eosinophils Relative: 0 %
HCT: 38.2 % (ref 36.0–46.0)
Hemoglobin: 12.4 g/dL (ref 12.0–15.0)
Lymphocytes Relative: 10 %
Lymphs Abs: 0.6 10*3/uL — ABNORMAL LOW (ref 0.7–4.0)
MCH: 27 pg (ref 26.0–34.0)
MCHC: 32.5 g/dL (ref 30.0–36.0)
MCV: 83.2 fL (ref 80.0–100.0)
Monocytes Absolute: 0.4 10*3/uL (ref 0.1–1.0)
Monocytes Relative: 7 %
Myelocytes: 1 %
Neutro Abs: 4.7 10*3/uL (ref 1.7–7.7)
Neutrophils Relative %: 82 %
Platelets: 349 10*3/uL (ref 150–400)
RBC: 4.59 MIL/uL (ref 3.87–5.11)
RDW: 15.5 % (ref 11.5–15.5)
WBC: 5.7 10*3/uL (ref 4.0–10.5)
nRBC: 1.7 % — ABNORMAL HIGH (ref 0.0–0.2)
nRBC: 2 /100 WBC — ABNORMAL HIGH

## 2020-02-15 LAB — FERRITIN: Ferritin: 39 ng/mL (ref 11–307)

## 2020-02-15 LAB — C-REACTIVE PROTEIN: CRP: 0.7 mg/dL (ref <1.0)

## 2020-02-15 LAB — D-DIMER, QUANTITATIVE: D-Dimer, Quant: 0.85 ug/mL-FEU — ABNORMAL HIGH (ref 0.00–0.50)

## 2020-02-15 LAB — MAGNESIUM: Magnesium: 1.7 mg/dL (ref 1.7–2.4)

## 2020-02-15 MED ORDER — PREDNISONE 20 MG PO TABS
50.0000 mg | ORAL_TABLET | Freq: Every day | ORAL | Status: DC
  Administered 2020-02-15: 50 mg via ORAL
  Filled 2020-02-15: qty 3

## 2020-02-15 MED ORDER — PREDNISONE 20 MG PO TABS: 20.0000 mg | ORAL_TABLET | Freq: Every day | ORAL | Status: DC

## 2020-02-15 MED ORDER — ALBUTEROL SULFATE HFA 108 (90 BASE) MCG/ACT IN AERS: 2.0000 | INHALATION_SPRAY | RESPIRATORY_TRACT | 1 refills | Status: AC | PRN

## 2020-02-15 MED ORDER — PREDNISONE 20 MG PO TABS: 10.0000 mg | ORAL_TABLET | Freq: Every day | ORAL | Status: DC

## 2020-02-15 MED ORDER — METFORMIN HCL 1000 MG PO TABS: 1000.0000 mg | ORAL_TABLET | Freq: Two times a day (BID) | ORAL | 1 refills | Status: AC

## 2020-02-15 MED ORDER — PREDNISONE 20 MG PO TABS: 50.0000 mg | ORAL_TABLET | Freq: Every day | ORAL | Status: DC

## 2020-02-15 MED ORDER — PREDNISONE 10 MG PO TABS: ORAL_TABLET | ORAL | 0 refills | Status: AC

## 2020-02-15 NOTE — Discharge Instructions (Signed)
Date of Positive COVID Test:  02/11/20  Date COVID Isolation Ends: 03/03/20   COVID-19 COVID-19 is a respiratory infection that is caused by a virus called severe acute respiratory syndrome coronavirus 2 (SARS-CoV-2). The disease is also known as coronavirus disease or novel coronavirus. In some people, the virus may not cause any symptoms. In others, it may cause a serious infection. The infection can get worse quickly and can lead to complications, such as:  Pneumonia, or infection of the lungs.  Acute respiratory distress syndrome or ARDS. This is a condition in which fluid build-up in the lungs prevents the lungs from filling with air and passing oxygen into the blood.  Acute respiratory failure. This is a condition in which there is not enough oxygen passing from the lungs to the body or when carbon dioxide is not passing from the lungs out of the body.  Sepsis or septic shock. This is a serious bodily reaction to an infection.  Blood clotting problems.  Secondary infections due to bacteria or fungus.  Organ failure. This is when your body's organs stop working. The virus that causes COVID-19 is contagious. This means that it can spread from person to person through droplets from coughs and sneezes (respiratory secretions). What are the causes? This illness is caused by a virus. You may catch the virus by:  Breathing in droplets from an infected person. Droplets can be spread by a person breathing, speaking, singing, coughing, or sneezing.  Touching something, like a table or a doorknob, that was exposed to the virus (contaminated) and then touching your mouth, nose, or eyes. What increases the risk? Risk for infection You are more likely to be infected with this virus if you:  Are within 6 feet (2 meters) of a person with COVID-19.  Provide care for or live with a person who is infected with COVID-19.  Spend time in crowded indoor spaces or live in shared housing. Risk  for serious illness You are more likely to become seriously ill from the virus if you:  Are 27 years of age or older. The higher your age, the more you are at risk for serious illness.  Live in a nursing home or long-term care facility.  Have cancer.  Have a long-term (chronic) disease such as: ? Chronic lung disease, including chronic obstructive pulmonary disease or asthma. ? A long-term disease that lowers your body's ability to fight infection (immunocompromised). ? Heart disease, including heart failure, a condition in which the arteries that lead to the heart become narrow or blocked (coronary artery disease), a disease which makes the heart muscle thick, weak, or stiff (cardiomyopathy). ? Diabetes. ? Chronic kidney disease. ? Sickle cell disease, a condition in which red blood cells have an abnormal "sickle" shape. ? Liver disease.  Are obese. What are the signs or symptoms? Symptoms of this condition can range from mild to severe. Symptoms may appear any time from 2 to 14 days after being exposed to the virus. They include:  A fever or chills.  A cough.  Difficulty breathing.  Headaches, body aches, or muscle aches.  Runny or stuffy (congested) nose.  A sore throat.  New loss of taste or smell. Some people may also have stomach problems, such as nausea, vomiting, or diarrhea. Other people may not have any symptoms of COVID-19. How is this diagnosed? This condition may be diagnosed based on:  Your signs and symptoms, especially if: ? You live in an area with a COVID-19 outbreak. ?  You recently traveled to or from an area where the virus is common. ? You provide care for or live with a person who was diagnosed with COVID-19. ? You were exposed to a person who was diagnosed with COVID-19.  A physical exam.  Lab tests, which may include: ? Taking a sample of fluid from the back of your nose and throat (nasopharyngeal fluid), your nose, or your throat using a  swab. ? A sample of mucus from your lungs (sputum). ? Blood tests.  Imaging tests, which may include, X-rays, CT scan, or ultrasound. How is this treated? At present, there is no medicine to treat COVID-19. Medicines that treat other diseases are being used on a trial basis to see if they are effective against COVID-19. Your health care provider will talk with you about ways to treat your symptoms. For most people, the infection is mild and can be managed at home with rest, fluids, and over-the-counter medicines. Treatment for a serious infection usually takes places in a hospital intensive care unit (ICU). It may include one or more of the following treatments. These treatments are given until your symptoms improve.  Receiving fluids and medicines through an IV.  Supplemental oxygen. Extra oxygen is given through a tube in the nose, a face mask, or a hood.  Positioning you to lie on your stomach (prone position). This makes it easier for oxygen to get into the lungs.  Continuous positive airway pressure (CPAP) or bi-level positive airway pressure (BPAP) machine. This treatment uses mild air pressure to keep the airways open. A tube that is connected to a motor delivers oxygen to the body.  Ventilator. This treatment moves air into and out of the lungs by using a tube that is placed in your windpipe.  Tracheostomy. This is a procedure to create a hole in the neck so that a breathing tube can be inserted.  Extracorporeal membrane oxygenation (ECMO). This procedure gives the lungs a chance to recover by taking over the functions of the heart and lungs. It supplies oxygen to the body and removes carbon dioxide. Follow these instructions at home: Lifestyle  If you are sick, stay home except to get medical care. Your health care provider will tell you how long to stay home. Call your health care provider before you go for medical care.  Rest at home as told by your health care provider.  Do  not use any products that contain nicotine or tobacco, such as cigarettes, e-cigarettes, and chewing tobacco. If you need help quitting, ask your health care provider.  Return to your normal activities as told by your health care provider. Ask your health care provider what activities are safe for you. General instructions  Take over-the-counter and prescription medicines only as told by your health care provider.  Drink enough fluid to keep your urine pale yellow.  Keep all follow-up visits as told by your health care provider. This is important. How is this prevented?  There is no vaccine to help prevent COVID-19 infection. However, there are steps you can take to protect yourself and others from this virus. To protect yourself:   Do not travel to areas where COVID-19 is a risk. The areas where COVID-19 is reported change often. To identify high-risk areas and travel restrictions, check the CDC travel website: FatFares.com.br  If you live in, or must travel to, an area where COVID-19 is a risk, take precautions to avoid infection. ? Stay away from people who are sick. ?  Wash your hands often with soap and water for 20 seconds. If soap and water are not available, use an alcohol-based hand sanitizer. ? Avoid touching your mouth, face, eyes, or nose. ? Avoid going out in public, follow guidance from your state and local health authorities. ? If you must go out in public, wear a cloth face covering or face mask. Make sure your mask covers your nose and mouth. ? Avoid crowded indoor spaces. Stay at least 6 feet (2 meters) away from others. ? Disinfect objects and surfaces that are frequently touched every day. This may include:  Counters and tables.  Doorknobs and light switches.  Sinks and faucets.  Electronics, such as phones, remote controls, keyboards, computers, and tablets. To protect others: If you have symptoms of COVID-19, take steps to prevent the virus from  spreading to others.  If you think you have a COVID-19 infection, contact your health care provider right away. Tell your health care team that you think you may have a COVID-19 infection.  Stay home. Leave your house only to seek medical care. Do not use public transport.  Do not travel while you are sick.  Wash your hands often with soap and water for 20 seconds. If soap and water are not available, use alcohol-based hand sanitizer.  Stay away from other members of your household. Let healthy household members care for children and pets, if possible. If you have to care for children or pets, wash your hands often and wear a mask. If possible, stay in your own room, separate from others. Use a different bathroom.  Make sure that all people in your household wash their hands well and often.  Cough or sneeze into a tissue or your sleeve or elbow. Do not cough or sneeze into your hand or into the air.  Wear a cloth face covering or face mask. Make sure your mask covers your nose and mouth. Where to find more information  Centers for Disease Control and Prevention: PurpleGadgets.be  World Health Organization: https://www.castaneda.info/ Contact a health care provider if:  You live in or have traveled to an area where COVID-19 is a risk and you have symptoms of the infection.  You have had contact with someone who has COVID-19 and you have symptoms of the infection. Get help right away if:  You have trouble breathing.  You have pain or pressure in your chest.  You have confusion.  You have bluish lips and fingernails.  You have difficulty waking from sleep.  You have symptoms that get worse. These symptoms may represent a serious problem that is an emergency. Do not wait to see if the symptoms will go away. Get medical help right away. Call your local emergency services (911 in the [place] Do not drive yourself to the hospital. Let the  emergency medical personnel know if you think you have COVID-19. Summary  COVID-19 is a respiratory infection that is caused by a virus. It is also known as coronavirus disease or novel coronavirus. It can cause serious infections, such as pneumonia, acute respiratory distress syndrome, acute respiratory failure, or sepsis.  The virus that causes COVID-19 is contagious. This means that it can spread from person to person through droplets from breathing, speaking, singing, coughing, or sneezing.  You are more likely to develop a serious illness if you are 27 years of age or older, have a weak immune system, live in a nursing home, or have chronic disease.  There is no medicine to treat  COVID-19. Your health care provider will talk with you about ways to treat your symptoms.  Take steps to protect yourself and others from infection. Wash your hands often and disinfect objects and surfaces that are frequently touched every day. Stay away from people who are sick and wear a mask if you are sick. This information is not intended to replace advice given to you by your health care provider. Make sure you discuss any questions you have with your health care provider. Document Revised: 02/21/2019 Document Reviewed: 05/30/2018 Elsevier Patient Education  2020 [employer].   COVID-19 COVID-19 El COVID-19 es una infeccin respiratoria causada por un virus llamado coronavirus tipo 2 causante del sndrome respiratorio agudo grave (SARS-CoV-2). La enfermedad tambin se conoce como enfermedad por coronavirus o nuevo coronavirus. En algunas personas, el virus puede no ocasionar sntomas. En otras, puede producir una infeccin grave. La infeccin puede empeorar rpidamente y causar complicaciones, como:  Neumona o infeccin en los pulmones.  Sndrome de dificultad respiratoria aguda o SDRA. Es una afeccin que se caracteriza por la acumulacin de lquido en los pulmones, que impide que los pulmones se llenen  de aire y pasen oxgeno a Risk manager.  Insuficiencia respiratoria aguda. Es una afeccin que se caracteriza porque no pasa suficiente oxgeno de los pulmones al cuerpo o porque el dixido de carbono no pasa de los pulmones hacia afuera del cuerpo.  Sepsis o choque sptico. Se trata de una reaccin grave del cuerpo ante una infeccin.  Problemas de coagulacin.  Infecciones secundarias debido a bacterias u hongos.  Falla de rganos. Ocurre cuando los rganos del cuerpo dejan de funcionar. El virus que causa el COVID-19 es contagioso. Esto significa que puede transmitirse de [place] persona a otra a travs de las gotitas de saliva de la tos y de los estornudos (secreciones respiratorias). Cules son las causas? Esta enfermedad es causada por un virus. Usted puede contagiarse con este virus:  Al inspirar las gotitas de una persona infectada. Las [employer]'s pueden diseminarse cuando una persona respira, habla, canta, tose o estornuda.  Al tocar algo, como una mesa o el picaportes de [place], que estuvo expuesto al virus (contaminado) y luego tocarse la boca, nariz o los ojos. Qu incrementa el riesgo? Riesgo de infeccin Es ms probable que se infecte con este virus si:  Se encuentra a Social worker a 6 pies (2 metros) de Medical laboratory scientific officer con COVID-19.  Cuida o vive con una persona infectada con COVID-19.  Pasa tiempo en espacios interiores repletos de gente o vive en viviendas compartidas. Riesgo de enfermedad grave Es ms probable que se enferme gravemente por el virus si:  Tiene 52aos o ms. Cuanto mayor sea su edad, mayor ser el riesgo de tener una enfermedad grave.  Vive en un hogar de ancianos o centro de atencin a Air cabin crew.  Tiene cncer.  Tiene una enfermedad prolongada (crnica), como las siguientes: ? Enfermedad pulmonar crnica, que incluye la enfermedad pulmonar obstructiva crnica o asma. ? Una enfermedad crnica que disminuye la capacidad del cuerpo para combatir  las infecciones (immunocomprometido). ? Enfermedad cardaca, que incluye insuficiencia cardaca, una afeccin que se caracteriza porque las arterias que llegan al corazn se Engineer, technical sales u obstruyen (arteriopata coronaria) o una enfermedad que provoca que el msculo cardaco se engrose, se debilite o endurezca (miocardiopata). ? Diabetes. ? Enfermedad renal crnica. ? Anemia drepanoctica, una enfermedad que se caracteriza porque los glbulos rojos tienen una forma anormal de "hoz". ? Enfermedad heptica.  Es obeso. Cules  son los signos o sntomas? Los sntomas de esta afeccin pueden ser de leves a graves. Los sntomas pueden aparecer en el trmino de 2 a [address] das despus de haber estado expuesto al virus. Incluyen los siguientes:  Fiebre o escalofros.  Tos.  Dificultad para respirar.  Dolores de [place], dolores en el cuerpo o dolores musculares.  Secrecin o congestin nasal.  Dolor de garganta.  Nueva prdida del sentido del gusto o del olfato. Algunas personas tambin pueden Matteltener problemas estomacales, como nuseas, vmitos o diarrea. Es posible que otras personas no tengan sntomas de COVID-19. Cmo se diagnostica? Esta afeccin se puede diagnosticar en funcin de lo siguiente:  Sus signos y sntomas, especialmente si: ? Vive en una zona donde hay un brote de COVID-19. ? Viaj recientemente a una zona donde el virus es frecuente. ? Cuida o vive con Neomia Dearuna persona a quien se le diagnostic COVID-19. ? Odelia Gagestuvo expuesto a una persona a la que se le diagnostic COVID-19.  Un examen fsico.  Anlisis de laboratorio que pueden incluir: ? Tomar una muestra de lquido de la parte posterior de la nariz y la garganta (lquido nasofarngeo), la nariz o la garganta, con un hisopo. ? Una muestra de mucosidad de los pulmones (esputo). ? Anlisis de Reifftonsangre.  Los estudios de diagnstico por imgenes pueden incluir radiografas, exploracin por tomografa computarizada (TC) o  ecografa. Cmo se trata? En este momento, no hay ningn medicamento para tratar el COVID-19. Los medicamentos para tratar otras enfermedades se usan a modo de ensayo para comprobar si son eficaces contra el COVID-19. El mdico le informar sobre las maneras de tratar los sntomas. En la Franklin Resourcesmayora de las personas, la infeccin es leve y puede controlarse en el hogar con reposo, lquidos y medicamentos de [place]venta libre. El tratamiento para una infeccin grave suele realizarse en la unidad de cuidados intensivos (UCI) de un hospital. Puede incluir uno o ms de los siguientes. Estos tratamientos se administran hasta que los sntomas mejoran.  Recibir lquidos y United Parcelmedicamentos a travs de una va intravenosa.  Oxgeno complementario. Para administrar oxgeno extra, se [place]) Islandsutiliza un tubo en la Darene Lamernariz, una mascarilla o una campana de oxgeno.  Colocarlo para que se recueste boca abajo (decbito prono). Esto facilita el ingreso de oxgeno a los pulmones.  Uso continuo de Comorosuna mquina de presin positiva de las vas areas (CPAP) o de presin positiva de las vas areas de dos niveles (BPAP). Este tratamiento utiliza una presin de aire leve para Pharmacologistmantener las vas respiratorias abiertas. Un tubo conectado a un motor administra oxgeno al cuerpo.  Respirador. Este tratamiento mueve el aire dentro y fuera de los pulmones mediante el uso de un tubo que se coloca en la trquea.  Traqueostoma. En este procedimiento se hace un orificio en el cuello para insertar un tubo de respiracin.  Oxigenacin por membrana extracorprea (OMEC). En este procedimiento, los pulmones tienen la posibilidad de recuperarse al asumir las funciones del corazn y los pulmones. Suministra oxgeno al cuerpo y elimina el dixido de carbono. Siga estas instrucciones en su casa: Estilo de vida  Si est enfermo, qudese en su casa, excepto para obtener atencin mdica. El mdico le indicar cunto tiempo debe quedarse en casa. Llame al mdico  antes de buscar atencin mdica.  Haga reposo en su casa como se lo haya indicado el mdico.  No consuma ningn producto que contenga nicotina o tabaco, como cigarrillos, cigarrillos electrnicos y tabaco de Theatre managermascar. Si necesita ayuda para dejar de fumar, consulte al  mdico.  Retome sus actividades normales segn lo indicado por el mdico. Pregntele al mdico qu actividades son seguras para usted. Instrucciones generales  Use los medicamentos de venta libre y los recetados solamente como se lo haya indicado el mdico.  Beba suficiente lquido como para Pharmacologist la orina de color amarillo plido.  Concurra a todas las visitas de [address] se lo haya indicado el mdico. Esto es importante. Cmo se evita?  No hay ninguna vacuna que ayude a prevenir la infeccin por COVID-19. Sin embargo, hay medidas que puede tomar para protegerse y Conservator, museum/gallery a Economist de este virus. Para protegerse:   No viaje a zonas donde el COVID-19 sea un riesgo. Las zonas donde se informa la presencia del COVID-19 cambian con frecuencia. Para identificar las zonas de alto riesgo y las restricciones de viaje, consulte el sitio web de viajes de Building control surveyor for [employer] and Prevention Insurance claims handler) (Centros para el Control y la Prevencin de Event organiser): StageSync.si  Si vive o debe viajar a una zona donde el COVID-19 es un riesgo, tome precauciones para evitar infecciones. ? Aljese de [employer]. ? Lvese las manos frecuentemente con agua y [place]. Use desinfectante para manos con alcohol si no dispone de [place] y [place]. ? Evite tocarse la boca, la cara, los ojos o la [place]. ? Evite salir de su casa, siga las indicaciones de su estado y de las autoridades sanitarias locales. ? Si debe salir de su casa, use un barbijo de tela o una mascarilla facial. Asegrese de que le cubra la nariz y la boca. ? Evite los espacios interiores repletos de gente. Mantenga una  distancia de al menos 6 pies (2 metros) de Economist. ? Desinfecte los objetos y las superficies que se tocan con frecuencia todos [place]. Pueden incluir:  Encimeras y [place].  Picaportes e interruptores de luz.  Lavabos, fregaderos y grifos.  Aparatos electrnicos tales como telfonos, controles remotos, teclados, computadoras y tabletas. Cmo proteger a los dems: Si tiene sntomas de COVID-19, tome medidas para evitar que el virus se propague a Economist.  Si cree que tiene una infeccin por COVID-19, comunquese de inmediato con su mdico. Informe al equipo de atencin mdica que cree que puede tener una infeccin por el COVID-19.  Qudese en su casa. Salga de su casa solo para buscar atencin mdica. No utilice el transporte pblico.  No viaje mientras est enfermo.  Lvese las manos frecuentemente con agua y [place]. Usar desinfectante para manos con alcohol si no dispone de [place] y [place].  Mantngase alejado de quienes vivan con usted. Permita que los miembros de la familia sanos cuiden a los nios y las [place], si es posible. Si tiene que cuidar a los nios o las mascotas, lvese las manos con frecuencia y use un barbijo. Si es posible, permanezca en su habitacin, separado de los dems. Utilice un bao diferente.  Asegrese de que todas las personas que viven en su casa se laven bien las manos y con frecuencia.  Tosa o estornude en un pauelo de papel o sobre su manga o codo. No tosa o estornude al aire ni se cubra la boca o la nariz con la [place].  Use un barbijo de tela o una mascarilla facial. Asegrese de que le cubra la nariz y la boca. Dnde buscar ms informacin  Centers for Disease Control and Prevention (Centros para el Control y la Prevencin de Event organiser): StickerEmporium.tn  World Science writer (Organizacin  Mundial de [employer]J's): https://thompson-craig.com/ Comunquese con un mdico  si:  Vive o ha viajado a una zona donde el COVID-19 es un riesgo y tiene sntomas de infeccin.  Ha tenido contacto con alguien que tiene COVID-19 y usted tiene sntomas de infeccin. Solicite ayuda inmediatamente si:  Tiene dificultad para respirar.  Siente dolor u opresin en el pecho.  Experimenta confusin.  Tiene las uas de los dedos y los labios de color [place].  Tiene dificultad para despertarse.  Los sntomas empeoran. Estos sntomas pueden representar un problema grave que constituye Radio broadcast assistant. No espere a ver si los sntomas desaparecen. Solicite atencin mdica de inmediato. Comunquese con el servicio de emergencias de su localidad (911 en los [place]). No conduzca por sus propios medios [employer] hospital. Informe al personal mdico de emergencias si cree que tiene COVID-19. Resumen  El COVID-19 es una infeccin respiratoria causada por un virus. Tambin se conoce como enfermedad por coronavirus o nuevo coronavirus. Puede causar infecciones graves, como neumona, sndrome de dificultad respiratoria aguda, insuficiencia respiratoria aguda o sepsis.  El virus que causa el COVID-19 es contagioso. Esto significa que puede transmitirse de [place] persona a otra a travs de las gotitas que se despiden al respirar, Heritage manager, cantar, toser y Engineering geologist.  Es ms probable que desarrolle una enfermedad grave si tiene 50 aos o ms, tiene el sistema inmunitario dbil, vive en un hogar de ancianos o tiene una enfermedad crnica.  No hay ningn medicamento para tratar el COVID-19. El mdico le informar sobre las maneras de tratar los sntomas.  Tome medidas para protegerse y Conservator, museum/gallery a los Merchandiser, retail las infecciones. Lvese las manos con frecuencia y desinfecte los objetos y las superficies que se tocan con frecuencia todos [place]. Mantngase alejado de las personas que estn enfermas y use un barbijo si est enfermo. Esta informacin no tiene Theme park manager el consejo  del mdico. Asegrese de hacerle al mdico cualquier pregunta que tenga. Document Revised: 02/27/2019 Document Reviewed: 06/22/2018 Elsevier Patient Education  2020 Elsevier Inc.  COVID-19: How to Protect Yourself and Others Know how it spreads  There is currently no vaccine to prevent coronavirus disease 2019 (COVID-19).  The best way to prevent illness is to avoid being exposed to this virus.  The virus is thought to spread mainly from person-to-person. ? Between people who are in close contact with one another (within about 6 feet). ? Through respiratory droplets produced when an infected person coughs, sneezes or talks. ? These droplets can land in the mouths or noses of people who are nearby or possibly be inhaled into the lungs. ? COVID-19 may be spread by people who are not showing symptoms. Everyone should Clean your hands often  Wash your hands often with soap and water for at least 20 seconds especially after you have been in a public place, or after blowing your nose, coughing, or sneezing.  If soap and water are not readily available, use a hand sanitizer that contains at least 60% alcohol. Cover all surfaces of your hands and rub them together until they feel dry.  Avoid touching your eyes, nose, and mouth with unwashed hands. Avoid close contact  Limit contact with others as much as possible.  Avoid close contact with people who are sick.  Put distance between yourself and other people. ? Remember that some people without symptoms may be able to spread virus. ? This is especially important for people who are at higher risk of getting very [email or web address].it  Cover your mouth and nose with a mask when around others  You could spread COVID-19 to others even if you do not feel sick.  Everyone should wear a mask in public settings and when around people not living in their household, especially when  social distancing is difficult to maintain. ? Masks should not be placed on young children under age 59, anyone who has trouble breathing, or is unconscious, incapacitated or otherwise unable to remove the mask without assistance.  The mask is meant to protect other people in case you are infected.  Do NOT use a facemask meant for a Research scientist (physical sciences).  Continue to keep about 6 feet between yourself and others. The mask is not a substitute for social distancing. Cover coughs and sneezes  Always cover your mouth and nose with a tissue when you cough or sneeze or use the inside of your elbow.  Throw used tissues in the trash.  Immediately wash your hands with soap and water for at least 20 seconds. If soap and water are not readily available, clean your hands with a hand sanitizer that contains at least 60% alcohol. Clean and disinfect  Clean AND disinfect frequently touched surfaces daily. This includes tables, doorknobs, light switches, countertops, handles, desks, phones, keyboards, toilets, faucets, and sinks. [email or web address]  If surfaces are dirty, clean them: Use detergent or soap and water prior to disinfection.  Then, use a household disinfectant. You can see a list of EPA-registered household disinfectants here. [email or web address].co.[email or web address] 01/08/2019 This information is not intended to replace advice given to you by your health care provider. Make sure you discuss any questions you have with your health care provider. Document Revised: 01/16/2019 Document Reviewed: 11/14/2018 Elsevier Patient Education  2020 Elsevier Inc.  COVID-19: Cmo protegerse y proteger a los dems COVID-19: How to Protect Yourself and Others Sepa cmo se propaga  Actualmente, no existe ninguna vacuna para prevenir la enfermedad por coronavirus 2019 (COVID-19).  La mejor forma de prevenir la enfermedad es evitar exponerse a este virus.  Se cree  que el virus se transmite principalmente de [place] persona a [place]. ? [employer] que estn en contacto directo entre s (a una distancia inferior a 6 pies [1.17m]). ? A travs de las gotitas respiratorias producidas cuando una persona infectada tose, estornuda o habla. ? Estas gotitas pueden caer en la boca o en la nariz de las personas que estn cerca o pueden ser [employer] pulmones. ? El COVID-19 puede ser transmitido por personas que no presentan sntomas. Lo que todos deben hacer [employer] las manos con frecuencia  Lvese las manos con frecuencia con agua y jabn durante al menos 20 segundos, especialmente despus de Oceanographer en un lugar pblico o despus de sonarse la nariz, toser o estornudar.  Si no dispone de [place] y [place], use un desinfectante de manos que contenga al menos un 60% de alcohol. Cubra todas las superficies de las manos y frtelas hasta que se sientan secas.  No se toque los ojos, la nariz y la boca sin antes lavarse las manos. Evite el contacto cercano  Limite el contacto directo con otras personas tanto como sea posible.  Evite el contacto cercano con personas que estn enfermas.  Establezca distancia entre usted y [employer]. ? Recuerde que [employer] no tienen sntomas pueden transmitir el virus. ? Esto es Software engineer para las personas que tienen ms riesgo de [email or web address] Cbrase la boca y la nariz con una mascarilla cuando est cerca  de [facility] transmitir el COVID-19 a Economist aunque no se sienta enfermo.  Todas las Statistician en lugares pblicos y cuando estn con otras que no vivan en su casa, especialmente cuando el distanciamiento social sea difcil de [place]. ? Las mascarillas no deben colocarse a nios menores de 2 aos de [place], a las personas que tienen problemas respiratorios o que estn  inconscientes, incapacitadas o que por algn motivo no puedan quitarse la mascarilla sin [place].  El propsito de Nurse, adult es proteger a Economist en caso de que usted est infectado.  NO utilice las [employer] a los trabajadores de [employer].  Contine manteniendo una distancia aproximada de 6 pies (1.16m) entre usted y [employer]. La mascarilla no reemplaza el distanciamiento social. Cbrase al toser y estornudar  Al toser o al estornudar, siempre cbrase la boca y la nariz con un pauelo descartable o use el interior del codo.  Deseche los pauelos descartables usados en la basura.  Inmediatamente, lvese las manos con agua y [place] durante al menos 20segundos. Si no dispone de agua y [place], [employer] con un desinfectante de manos que contenga al menos un 60% de alcohol. Limpie y desinfecte  Limpie Y desinfecte las superficies que se tocan con frecuencia todos los [address]  [address]. Esto incluye mesas, picaportes, interruptores de luz, encimeras, mangos, escritorios, telfonos, teclados, inodoros, grifos y lavabos. [email or web address]  Si las superficies estn sucias, lmpielas: Use detergente o jabn y agua antes de la desinfeccin.  Luego, use un desinfectante domstico. Puede consultar una lista de los desinfectantes domsticos registrados en Financial controller (EPA) (Agencia de Proteccin Ambiental) aqu. [email or web address] 01/08/2019 Esta informacin no tiene Theme park manager el consejo del mdico. Asegrese de hacerle al mdico cualquier pregunta que tenga. Document Revised: 01/22/2019 Document Reviewed: 11/15/2018 Elsevier Patient Education  2020 [employer].

## 2020-02-15 NOTE — Consult Note (Signed)
[name] Kidd  VEL:[id number] DOB: Feb 28, 1991 DOA: 02/11/2020 PCP: Gorden Harms, PA-C    Brief Narrative:  29yo with a history of subclinical hypothyroidism, depression, and gestational diabetes who is [redacted] weeks pregnant and presented to [facility] with shortness of breath. She reported generalized body aches and weakness for approximately 1 week leading up to the acute development of shortness of breath.  She also had a significant dry cough.  CXR noted diffuse pulmonary infiltrates consistent with Covid pneumonia.  Oxygen saturations were noted to dip below 94% with ambulation, and she was quite tachypneic.  Significant Events:  10/6 admit to [facility]  Date of Positive COVID Test:  10/6  Vaccination Status: Not vaccinated against Covid  COVID-19 specific Treatment: Remdesivir 10/6 > 10/10 Steroid 10/6 > 10/15  Antimicrobials:  None  DVT prophylaxis: Lovenox  Subjective: Feels much better today. C/O ongoing nagging cough. Is not SOB w/ exertion. Sats > 90% while ambulating on RA per RN. Sats > 90% while at rest. No chest pain, n/v, or abdom pain.   Pt is cleared for d/c home. She is warned to seek immediate medical attention should she develop worsening shortness of breath, fevers, or diarrhea.   Assessment & Plan:  COVID Pneumonia -acute hypoxic respiratory failure Continue steroid to complete a 10 day course, w/ taper post-d/c utilizing oral prednisone - completed her 5 day course of Remdesivir w/ her infusion today - significant bilateral infiltrates appreciable on CTa chest this admit - no indication for IL6 / JAK inhibitors presently - mobilized - successfully weaned to RA at rest - ambulatory oxygen saturations >90% - now medically cleared for d/c from CoViD standpoint   Recent Labs  Lab 02/11/20 0701 02/11/20 0757 02/12/20 0555 02/13/20 0602 02/14/20 0502 02/15/20 2585  DDIMER  --   --  0.84* 0.71* 0.87* 0.85*  FERRITIN  --  90  --  97 71 39  CRP  --   10.9* 8.6* 3.4* 1.3* 0.7  ALT 55*  --  74* 120* 131* 155*  PROCALCITON  --  0.42  --   --   --   --     Mild transaminitis Likely due to Covid itself with possible contribution from use of remdesivir - appear to have stabilized - Remdesivir being stopped after dose today - should soon begin improving  Mild hypomagnesemia Corrected with supplementation  Mild hypokalemia Corrected with supplementation  Gestational DM Care per OB  Subclinical hypothyroidism  [redacted] weeks pregnant Care per OB   Code Status: FULL CODE Family Communication: No family present at time of exam  Objective: Blood pressure 112/70, pulse (!) 101, temperature 97.7 F (36.5 C), temperature source Oral, resp. rate 16, height 5\' 4"  (1.626 m), weight 83.9 kg, last menstrual period 07/26/2019, SpO2 93 %, unknown if currently breastfeeding. No intake or output data in the 24 hours ending 02/15/20 1421 Filed Weights   02/11/20 1600  Weight: 83.9 kg    Examination: General: No acute respiratory distress -breathing comfortably on room air - alert and conversant  Lungs: improved air movement th/o all fields - no wheezing - some scattered fine crackles persist  Cardiovascular: RRR w/o M  Abdomen: Soft, BS positive, nontender Extremities: No edema B LE  CBC: Recent Labs  Lab 02/13/20 0602 02/14/20 0502 02/15/20 0652  WBC 4.5 5.2 5.7  NEUTROABS 2.7 3.3 4.7  HGB 11.8* 12.2 12.4  HCT 36.8 38.0 38.2  MCV 84.0 84.1 83.2  PLT 252 289 349   Basic  Metabolic Panel: Recent Labs  Lab 02/12/20 0555 02/12/20 0555 02/13/20 0602 02/14/20 0502 02/15/20 0652  NA 138   < > 138 138 137  K 3.6   < > 3.3* 3.7 3.5  CL 111   < > 113* 112* 108  CO2 11*   < > 12* 14* 15*  GLUCOSE 95   < > 109* 114* 112*  BUN 7   < > 10 9 9   CREATININE 0.52   < > 0.55 0.52 0.60  CALCIUM 8.8*   < > 8.6* 8.4* 8.5*  MG 1.6*   < > 1.6* 1.8 1.7  PHOS 4.4  --  4.3  --   --    < > = values in this interval not displayed.   GFR: Estimated  Creatinine Clearance: 110.7 mL/min (by C-G formula based on SCr of 0.6 mg/dL).  Liver Function Tests: Recent Labs  Lab 02/12/20 0555 02/13/20 0602 02/14/20 0502 02/15/20 0652  AST 126* 190* 173* 159*  ALT 74* 120* 131* 155*  ALKPHOS 98 95 93 99  BILITOT 0.6 0.7 0.2* 0.3  PROT 6.0* 5.9* 6.0* 6.1*  ALBUMIN 2.2* 2.1* 2.1* 2.1*   Recent Labs  Lab 02/11/20 0757  LIPASE 51    Recent Results (from the past 240 hour(s))  Resp Panel by RT PCR (RSV, Flu A&B, Covid) - Nasopharyngeal Swab     Status: Abnormal   Collection Time: 02/11/20  3:45 AM   Specimen: Nasopharyngeal Swab  Result Value Ref Range Status   SARS Coronavirus 2 by RT PCR POSITIVE (A) NEGATIVE Final    Comment: RESULT CALLED TO, READ BACK BY AND VERIFIED WITH:  LISA THOMPSON AT 0521 01/12/20 SDR (NOTE) SARS-CoV-2 target nucleic acids are DETECTED.  SARS-CoV-2 RNA is generally detectable in upper respiratory specimens  during the acute phase of infection. Positive results are indicative of the presence of the identified virus, but do not rule out bacterial infection or co-infection with other pathogens not detected by the test. Clinical correlation with patient history and other diagnostic information is necessary to determine patient infection status. The expected result is Negative.  Fact Sheet for Patients:  03/13/20  Fact Sheet for Healthcare Providers: https://www.moore.com/  This test is not yet approved or cleared by the https://www.young.biz/ FDA and  has been authorized for detection and/or diagnosis of SARS-CoV-2 by FDA under an Emergency Use Authorization (EUA).  This EUA will remain in effect (meaning this test can be u sed) for the duration of  the COVID-19 declaration under Section 564(b)(1) of the Act, 21 U.S.C. section 360bbb-3(b)(1), unless the authorization is terminated or revoked sooner.      Influenza A by PCR NEGATIVE NEGATIVE Final   Influenza  B by PCR NEGATIVE NEGATIVE Final    Comment: (NOTE) The Xpert Xpress SARS-CoV-2/FLU/RSV assay is intended as an aid in  the diagnosis of influenza from Nasopharyngeal swab specimens and  should not be used as a sole basis for treatment. Nasal washings and  aspirates are unacceptable for Xpert Xpress SARS-CoV-2/FLU/RSV  testing.  Fact Sheet for Patients: [place]  Fact Sheet for Healthcare Providers: https://www.moore.com/  This test is not yet approved or cleared by the https://www.young.biz/ FDA and  has been authorized for detection and/or diagnosis of SARS-CoV-2 by  FDA under an Emergency Use Authorization (EUA). This EUA will remain  in effect (meaning this test can be used) for the duration of the  Covid-19 declaration under Section 564(b)(1) of the Act, 21  U.S.C.  section 360bbb-3(b)(1), unless the authorization is  terminated or revoked.    Respiratory Syncytial Virus by PCR NEGATIVE NEGATIVE Final    Comment: (NOTE) Fact Sheet for Patients: https://www.moore.com/  Fact Sheet for Healthcare Providers: https://www.young.biz/  This test is not yet approved or cleared by the [place] FDA and  has been authorized for detection and/or diagnosis of SARS-CoV-2 by  FDA under an Emergency Use Authorization (EUA). This EUA will remain  in effect (meaning this test can be used) for the duration of the  COVID-19 declaration under Section 564(b)(1) of the Act, 21 U.S.C.  section 360bbb-3(b)(1), unless the authorization is terminated or  revoked. Performed at [facility], [address] [place], [place] [address]   Culture, blood (Routine X 2) w Reflex to ID Panel     Status: None (Preliminary result)   Collection Time: 02/11/20  8:04 AM   Specimen: BLOOD  Result Value Ref Range Status   Specimen Description BLOOD RIGHT ANTECUBITAL  Final   Special Requests   Final    BOTTLES DRAWN  AEROBIC AND ANAEROBIC Blood Culture adequate volume   Culture   Final    NO GROWTH 4 DAYS Performed at [facility], [address] [place], [place] [address]    Report Status PENDING  Incomplete  Culture, blood (Routine X 2) w Reflex to ID Panel     Status: None (Preliminary result)   Collection Time: 02/11/20  8:04 AM   Specimen: BLOOD  Result Value Ref Range Status   Specimen Description BLOOD BLOOD LEFT HAND  Final   Special Requests   Final    BOTTLES DRAWN AEROBIC AND ANAEROBIC Blood Culture adequate volume   Culture   Final    NO GROWTH 4 DAYS Performed at [facility], [address], [place], [place] [address]    Report Status PENDING  Incomplete     Scheduled Meds: . docusate sodium  100 mg Oral Daily  . enoxaparin (LOVENOX) injection  40 mg Subcutaneous Q24H  . insulin aspart  0-15 Units Subcutaneous TID PC  . metFORMIN  1,000 mg Oral BID WC  . predniSONE  50 mg Oral Q breakfast   Followed by  . [START ON 02/17/2020] predniSONE  20 mg Oral Q breakfast   Followed by  . [START ON 02/19/2020] predniSONE  10 mg Oral Q breakfast  . prenatal multivitamin  1 tablet Oral Q1200  . sodium chloride flush  3 mL Intravenous Q12H   Continuous Infusions: . sodium chloride       LOS: 4 days   Lonia Blood, MD [facility] Office  [phone] Pager - Text Page per Amion  If 7PM-7AM, please contact night-coverage per Amion 02/15/2020, 2:21 PM

## 2020-02-15 NOTE — Discharge Summary (Signed)
Antenatal Physician Discharge Summary  Patient ID: [name] Kidd MRN: [name].o.  Admit date: 02/11/2020 Discharge date: 02/15/2020  Admission Diagnoses:  Principal Problem:   Pneumonia due to COVID-19 virus Active Problems:   Hypothyroidism   [redacted] weeks gestation of pregnancy  Oral hypoglycemic gestational diabetes mellitus (GDM) in third trimester  Discharge Diagnoses: The same  Prenatal Procedures: NST  Consults: TRH/Internal Medicine  Hospital Course:  [name] Kidd is a 29 y.o. G2P1001 with IUP at [redacted]w[redacted]d admitted for COVID pneumona, was a transfer from [facility].  She was initially on oxygen therapy, started on Remdesivir and steroids as per co-management with TRH team. She was weaned off oxygen and was stable on room air by time of discharge. Completed Remdesivir course and was still on oral steroids.  Her blood sugars increased while on steroids, had to be started on Metformin for control.  She had no other acute obstetrical concerns. Her fetal heart rate monitoring remained reassuring, and she had no signs/symptoms of preterm labor or other maternal-fetal concerns.  She was deemed stable for discharge to home with outpatient follow up.  Discharge Exam: Temp:  [97.7 F (36.5 C)-98.2 F (36.8 C)] 97.8 F (36.6 C) (10/10 1525) Pulse Rate:  [80-104] 91 (10/10 1525) Resp:  [16-42] 17 (10/10 1525) BP: (108-120)/(65-70) 108/67 (10/10 1525) SpO2:  [91 %-95 %] 94 % (10/10 1525) Physical Examination: CONSTITUTIONAL: Well-developed, well-nourished female in no acute distress.  HENT:  Normocephalic, atraumatic, External right and left ear normal. Oropharynx is clear and moist EYES: Conjunctivae and EOM are normal. Pupils are equal, round, and reactive to light. No scleral icterus.  NECK: Normal range of motion, supple, no masses SKIN: Skin is warm and dry. No rash noted. Not diaphoretic. No erythema. No pallor. NEUROLGIC: Alert  and oriented to person, place, and time. Normal reflexes, muscle tone coordination. No cranial nerve deficit noted. PSYCHIATRIC: Normal mood and affect. Normal behavior. Normal judgment and thought content. CARDIOVASCULAR: Normal heart rate noted, regular rhythm RESPIRATORY: Effort and breath sounds normal, no problems with respiration noted MUSCULOSKELETAL: Normal range of motion. No edema and no tenderness. 2+ distal pulses. ABDOMEN: Soft, nontender, nondistended, gravid. CERVIX:  Deferred  Fetal monitoring: FHR: 140 bpm, Variability: moderate, Accelerations: Present, Decelerations: Absent  Uterine activity: 1-2 contractions per hour  Significant Diagnostic Studies:  Results for orders placed or performed during the hospital encounter of 02/11/20 (from the past 168 hour(s))  CBC   Collection Time: 02/11/20  4:11 PM  Result Value Ref Range   WBC 5.2 4.0 - 10.5 K/uL   RBC 4.31 3.87 - 5.11 MIL/uL   Hemoglobin 12.0 12.0 - 15.0 g/dL   HCT 47.4 36 - 46 %   MCV 85.2 80.0 - 100.0 fL   MCH 27.8 26.0 - 34.0 pg   MCHC 32.7 30.0 - 36.0 g/dL   RDW 25.9 56.3 - 87.5 %   Platelets 216 150 - 400 K/uL   nRBC 0.0 0.0 - 0.2 %  Creatinine, serum   Collection Time: 02/11/20  4:11 PM  Result Value Ref Range   Creatinine, Ser 0.58 0.44 - 1.00 mg/dL   GFR calc non Af Amer >60 >60 mL/min  Type and screen [facility]   Collection Time: 02/11/20  5:01 PM  Result Value Ref Range   ABO/RH(D) O POS    Antibody Screen NEG    Sample Expiration      02/14/2020,2359 Performed at [facility], [address] [place], [place] [address]   Comprehensive metabolic panel   Collection Time: 02/12/20  5:55 AM  Result Value Ref Range   Sodium 138 135 - 145 mmol/L   Potassium 3.6 3.5 - 5.1 mmol/L   Chloride 111 98 - 111 mmol/L   CO2 11 (L) 22 - 32 mmol/L   Glucose, Bld 95 70 - 99 mg/dL   BUN 7 6 - 20 mg/dL   Creatinine, Ser 8.46 0.44 - 1.00 mg/dL   Calcium 8.8 (L) 8.9 - 10.3 mg/dL    Total Protein 6.0 (L) 6.5 - 8.1 g/dL   Albumin 2.2 (L) 3.5 - 5.0 g/dL   AST 962 (H) 15 - 41 U/L   ALT 74 (H) 0 - 44 U/L   Alkaline Phosphatase 98 38 - 126 U/L   Total Bilirubin 0.6 0.3 - 1.2 mg/dL   GFR calc non Af Amer >60 >60 mL/min   Anion gap 16 (H) 5 - 15  C-reactive protein   Collection Time: 02/12/20  5:55 AM  Result Value Ref Range   CRP 8.6 (H) <1.0 mg/dL  CBC with Differential/Platelet   Collection Time: 02/12/20  5:55 AM  Result Value Ref Range   WBC 3.5 (L) 4.0 - 10.5 K/uL   RBC 4.26 3.87 - 5.11 MIL/uL   Hemoglobin 11.6 (L) 12.0 - 15.0 g/dL   HCT 95.2 36 - 46 %   MCV 85.2 80.0 - 100.0 fL   MCH 27.2 26.0 - 34.0 pg   MCHC 32.0 30.0 - 36.0 g/dL   RDW 84.1 (H) 32.4 - 40.1 %   Platelets 228 150 - 400 K/uL   nRBC 0.0 0.0 - 0.2 %   Neutrophils Relative % 63 %   Neutro Abs 2.2 1.7 - 7.7 K/uL   Lymphocytes Relative 31 %   Lymphs Abs 1.1 0.7 - 4.0 K/uL   Monocytes Relative 5 %   Monocytes Absolute 0.2 0.1 - 1.0 K/uL   Eosinophils Relative 0 %   Eosinophils Absolute 0.0 0 - 0 K/uL   Basophils Relative 0 %   Basophils Absolute 0.0 0 - 0 K/uL   Immature Granulocytes 1 %   Abs Immature Granulocytes 0.03 0.00 - 0.07 K/uL  D-dimer, quantitative (not at [facility])   Collection Time: 02/12/20  5:55 AM  Result Value Ref Range   D-Dimer, Quant 0.84 (H) 0.00 - 0.50 ug/mL-FEU  Magnesium   Collection Time: 02/12/20  5:55 AM  Result Value Ref Range   Magnesium 1.6 (L) 1.7 - 2.4 mg/dL  Phosphorus   Collection Time: 02/12/20  5:55 AM  Result Value Ref Range   Phosphorus 4.4 2.5 - 4.6 mg/dL  Glucose, capillary   Collection Time: 02/12/20 11:35 AM  Result Value Ref Range   Glucose-Capillary 149 (H) 70 - 99 mg/dL  Glucose, capillary   Collection Time: 02/12/20  4:44 PM  Result Value Ref Range   Glucose-Capillary 159 (H) 70 - 99 mg/dL  Glucose, capillary   Collection Time: 02/12/20 10:07 PM  Result Value Ref Range   Glucose-Capillary 111 (H) 70 - 99 mg/dL  Comprehensive  metabolic panel   Collection Time: 02/13/20  6:02 AM  Result Value Ref Range   Sodium 138 135 - 145 mmol/L   Potassium 3.3 (L) 3.5 - 5.1 mmol/L   Chloride 113 (H) 98 - 111 mmol/L   CO2 12 (L) 22 - 32 mmol/L   Glucose, Bld 109 (H) 70 - 99 mg/dL   BUN 10 6 -  20 mg/dL   Creatinine, Ser 1.61 0.44 - 1.00 mg/dL   Calcium 8.6 (L) 8.9 - 10.3 mg/dL   Total Protein 5.9 (L) 6.5 - 8.1 g/dL   Albumin 2.1 (L) 3.5 - 5.0 g/dL   AST 096 (H) 15 - 41 U/L   ALT 120 (H) 0 - 44 U/L   Alkaline Phosphatase 95 38 - 126 U/L   Total Bilirubin 0.7 0.3 - 1.2 mg/dL   GFR calc non Af Amer >60 >60 mL/min   Anion gap 13 5 - 15  C-reactive protein   Collection Time: 02/13/20  6:02 AM  Result Value Ref Range   CRP 3.4 (H) <1.0 mg/dL  CBC with Differential/Platelet   Collection Time: 02/13/20  6:02 AM  Result Value Ref Range   WBC 4.5 4.0 - 10.5 K/uL   RBC 4.38 3.87 - 5.11 MIL/uL   Hemoglobin 11.8 (L) 12.0 - 15.0 g/dL   HCT 04.5 36 - 46 %   MCV 84.0 80.0 - 100.0 fL   MCH 26.9 26.0 - 34.0 pg   MCHC 32.1 30.0 - 36.0 g/dL   RDW 40.9 (H) 81.1 - 91.4 %   Platelets 252 150 - 400 K/uL   nRBC 0.4 (H) 0.0 - 0.2 %   Neutrophils Relative % 60 %   Neutro Abs 2.7 1.7 - 7.7 K/uL   Lymphocytes Relative 33 %   Lymphs Abs 1.5 0.7 - 4.0 K/uL   Monocytes Relative 5 %   Monocytes Absolute 0.2 0.1 - 1.0 K/uL   Eosinophils Relative 0 %   Eosinophils Absolute 0.0 0 - 0 K/uL   Basophils Relative 0 %   Basophils Absolute 0.0 0 - 0 K/uL   Immature Granulocytes 2 %   Abs Immature Granulocytes 0.07 0.00 - 0.07 K/uL   Polychromasia PRESENT   D-dimer, quantitative (not at [facility])   Collection Time: 02/13/20  6:02 AM  Result Value Ref Range   D-Dimer, Quant 0.71 (H) 0.00 - 0.50 ug/mL-FEU  Magnesium   Collection Time: 02/13/20  6:02 AM  Result Value Ref Range   Magnesium 1.6 (L) 1.7 - 2.4 mg/dL  Phosphorus   Collection Time: 02/13/20  6:02 AM  Result Value Ref Range   Phosphorus 4.3 2.5 - 4.6 mg/dL  Ferritin   Collection  Time: 02/13/20  6:02 AM  Result Value Ref Range   Ferritin 97 11 - 307 ng/mL  Glucose, capillary   Collection Time: 02/13/20  9:21 AM  Result Value Ref Range   Glucose-Capillary 103 (H) 70 - 99 mg/dL  Glucose, capillary   Collection Time: 02/13/20 11:13 AM  Result Value Ref Range   Glucose-Capillary 138 (H) 70 - 99 mg/dL  Glucose, capillary   Collection Time: 02/13/20  3:06 PM  Result Value Ref Range   Glucose-Capillary 127 (H) 70 - 99 mg/dL  Glucose, capillary   Collection Time: 02/13/20  9:29 PM  Result Value Ref Range   Glucose-Capillary 122 (H) 70 - 99 mg/dL  Comprehensive metabolic panel   Collection Time: 02/14/20  5:02 AM  Result Value Ref Range   Sodium 138 135 - 145 mmol/L   Potassium 3.7 3.5 - 5.1 mmol/L   Chloride 112 (H) 98 - 111 mmol/L   CO2 14 (L) 22 - 32 mmol/L   Glucose, Bld 114 (H) 70 - 99 mg/dL   BUN 9 6 - 20 mg/dL   Creatinine, Ser 7.82 0.44 - 1.00 mg/dL   Calcium 8.4 (L) 8.9 - 10.3 mg/dL  Total Protein 6.0 (L) 6.5 - 8.1 g/dL   Albumin 2.1 (L) 3.5 - 5.0 g/dL   AST 559 (H) 15 - 41 U/L   ALT 131 (H) 0 - 44 U/L   Alkaline Phosphatase 93 38 - 126 U/L   Total Bilirubin 0.2 (L) 0.3 - 1.2 mg/dL   GFR, Estimated >74 >16 mL/min   Anion gap 12 5 - 15  C-reactive protein   Collection Time: 02/14/20  5:02 AM  Result Value Ref Range   CRP 1.3 (H) <1.0 mg/dL  CBC with Differential/Platelet   Collection Time: 02/14/20  5:02 AM  Result Value Ref Range   WBC 5.2 4.0 - 10.5 K/uL   RBC 4.52 3.87 - 5.11 MIL/uL   Hemoglobin 12.2 12.0 - 15.0 g/dL   HCT 38.4 36 - 46 %   MCV 84.1 80.0 - 100.0 fL   MCH 27.0 26.0 - 34.0 pg   MCHC 32.1 30.0 - 36.0 g/dL   RDW 53.6 (H) 46.8 - 03.2 %   Platelets 289 150 - 400 K/uL   nRBC 1.3 (H) 0.0 - 0.2 %   Neutrophils Relative % 62 %   Neutro Abs 3.3 1.7 - 7.7 K/uL   Lymphocytes Relative 31 %   Lymphs Abs 1.6 0.7 - 4.0 K/uL   Monocytes Relative 4 %   Monocytes Absolute 0.2 0.1 - 1.0 K/uL   Eosinophils Relative 0 %   Eosinophils  Absolute 0.0 0 - 0 K/uL   Basophils Relative 0 %   Basophils Absolute 0.0 0 - 0 K/uL   Immature Granulocytes 3 %   Abs Immature Granulocytes 0.13 (H) 0.00 - 0.07 K/uL   Smudge Cells PRESENT   D-dimer, quantitative (not at [facility])   Collection Time: 02/14/20  5:02 AM  Result Value Ref Range   D-Dimer, Quant 0.87 (H) 0.00 - 0.50 ug/mL-FEU  Magnesium   Collection Time: 02/14/20  5:02 AM  Result Value Ref Range   Magnesium 1.8 1.7 - 2.4 mg/dL  Ferritin   Collection Time: 02/14/20  5:02 AM  Result Value Ref Range   Ferritin 71 11 - 307 ng/mL  Glucose, capillary   Collection Time: 02/14/20  5:57 AM  Result Value Ref Range   Glucose-Capillary 104 (H) 70 - 99 mg/dL  Glucose, capillary   Collection Time: 02/14/20  1:38 PM  Result Value Ref Range   Glucose-Capillary 155 (H) 70 - 99 mg/dL  Glucose, capillary   Collection Time: 02/14/20  5:00 PM  Result Value Ref Range   Glucose-Capillary 101 (H) 70 - 99 mg/dL  Glucose, capillary   Collection Time: 02/14/20  9:06 PM  Result Value Ref Range   Glucose-Capillary 136 (H) 70 - 99 mg/dL  Comprehensive metabolic panel   Collection Time: 02/15/20  6:52 AM  Result Value Ref Range   Sodium 137 135 - 145 mmol/L   Potassium 3.5 3.5 - 5.1 mmol/L   Chloride 108 98 - 111 mmol/L   CO2 15 (L) 22 - 32 mmol/L   Glucose, Bld 112 (H) 70 - 99 mg/dL   BUN 9 6 - 20 mg/dL   Creatinine, Ser 1.22 0.44 - 1.00 mg/dL   Calcium 8.5 (L) 8.9 - 10.3 mg/dL   Total Protein 6.1 (L) 6.5 - 8.1 g/dL   Albumin 2.1 (L) 3.5 - 5.0 g/dL   AST 482 (H) 15 - 41 U/L   ALT 155 (H) 0 - 44 U/L   Alkaline Phosphatase 99 38 - 126 U/L  Total Bilirubin 0.3 0.3 - 1.2 mg/dL   GFR, Estimated >37 >10 mL/min   Anion gap 14 5 - 15  C-reactive protein   Collection Time: 02/15/20  6:52 AM  Result Value Ref Range   CRP 0.7 <1.0 mg/dL  CBC with Differential/Platelet   Collection Time: 02/15/20  6:52 AM  Result Value Ref Range   WBC 5.7 4.0 - 10.5 K/uL   RBC 4.59 3.87 - 5.11 MIL/uL    Hemoglobin 12.4 12.0 - 15.0 g/dL   HCT 62.6 36 - 46 %   MCV 83.2 80.0 - 100.0 fL   MCH 27.0 26.0 - 34.0 pg   MCHC 32.5 30.0 - 36.0 g/dL   RDW 94.8 54.6 - 27.0 %   Platelets 349 150 - 400 K/uL   nRBC 1.7 (H) 0.0 - 0.2 %   Neutrophils Relative % 82 %   Neutro Abs 4.7 1.7 - 7.7 K/uL   Lymphocytes Relative 10 %   Lymphs Abs 0.6 (L) 0.7 - 4.0 K/uL   Monocytes Relative 7 %   Monocytes Absolute 0.4 0.1 - 1.0 K/uL   Eosinophils Relative 0 %   Eosinophils Absolute 0.0 0 - 0 K/uL   Basophils Relative 0 %   Basophils Absolute 0.0 0 - 0 K/uL   nRBC 2 (H) 0 /100 WBC   Myelocytes 1 %   Abs Immature Granulocytes 0.10 (H) 0.00 - 0.07 K/uL   Polychromasia PRESENT   D-dimer, quantitative (not at [facility])   Collection Time: 02/15/20  6:52 AM  Result Value Ref Range   D-Dimer, Quant 0.85 (H) 0.00 - 0.50 ug/mL-FEU  Magnesium   Collection Time: 02/15/20  6:52 AM  Result Value Ref Range   Magnesium 1.7 1.7 - 2.4 mg/dL  Ferritin   Collection Time: 02/15/20  6:52 AM  Result Value Ref Range   Ferritin 39 11 - 307 ng/mL  Glucose, capillary   Collection Time: 02/15/20  8:13 AM  Result Value Ref Range   Glucose-Capillary 105 (H) 70 - 99 mg/dL  Glucose, capillary   Collection Time: 02/15/20 10:40 AM  Result Value Ref Range   Glucose-Capillary 118 (H) 70 - 99 mg/dL  Glucose, capillary   Collection Time: 02/15/20  3:28 PM  Result Value Ref Range   Glucose-Capillary 161 (H) 70 - 99 mg/dL  Results for orders placed or performed during the hospital encounter of 02/11/20 (from the past 168 hour(s))  Resp Panel by RT PCR (RSV, Flu A&B, Covid) - Nasopharyngeal Swab   Collection Time: 02/11/20  3:45 AM   Specimen: Nasopharyngeal Swab  Result Value Ref Range   SARS Coronavirus 2 by RT PCR POSITIVE (A) NEGATIVE   Influenza A by PCR NEGATIVE NEGATIVE   Influenza B by PCR NEGATIVE NEGATIVE   Respiratory Syncytial Virus by PCR NEGATIVE NEGATIVE  CBC   Collection Time: 02/11/20  7:01 AM  Result Value Ref  Range   WBC 6.7 4.0 - 10.5 K/uL   RBC 3.96 3.87 - 5.11 MIL/uL   Hemoglobin 11.0 (L) 12.0 - 15.0 g/dL   HCT 35.0 (L) 36 - 46 %   MCV 83.6 80.0 - 100.0 fL   MCH 27.8 26.0 - 34.0 pg   MCHC 33.2 30.0 - 36.0 g/dL   RDW 09.3 81.8 - 29.9 %   Platelets 194 150 - 400 K/uL   nRBC 0.0 0.0 - 0.2 %  Comprehensive metabolic panel   Collection Time: 02/11/20  7:01 AM  Result Value Ref Range  Sodium 132 (L) 135 - 145 mmol/L   Potassium 3.9 3.5 - 5.1 mmol/L   Chloride 107 98 - 111 mmol/L   CO2 12 (L) 22 - 32 mmol/L   Glucose, Bld 80 70 - 99 mg/dL   BUN <5 (L) 6 - 20 mg/dL   Creatinine, Ser 0.980.52 0.44 - 1.00 mg/dL   Calcium 8.0 (L) 8.9 - 10.3 mg/dL   Total Protein 6.2 (L) 6.5 - 8.1 g/dL   Albumin 2.5 (L) 3.5 - 5.0 g/dL   AST 119104 (H) 15 - 41 U/L   ALT 55 (H) 0 - 44 U/L   Alkaline Phosphatase 90 38 - 126 U/L   Total Bilirubin 1.0 0.3 - 1.2 mg/dL   GFR calc non Af Amer >60 >60 mL/min   Anion gap 13 5 - 15  Fibrin derivatives D-Dimer   Collection Time: 02/11/20  7:01 AM  Result Value Ref Range   Fibrin derivatives D-dimer ([facility]) 1,844.56 (H) 0.00 - 499.00 ng/mL (FEU)  Triglycerides   Collection Time: 02/11/20  7:01 AM  Result Value Ref Range   Triglycerides 205 (H) <150 mg/dL  HIV Antibody (routine testing w rflx)   Collection Time: 02/11/20  7:57 AM  Result Value Ref Range   HIV Screen 4th Generation wRfx Non Reactive Non Reactive  Brain natriuretic peptide   Collection Time: 02/11/20  7:57 AM  Result Value Ref Range   B Natriuretic Peptide 22.8 0.0 - 100.0 pg/mL  C-reactive protein   Collection Time: 02/11/20  7:57 AM  Result Value Ref Range   CRP 10.9 (H) <1.0 mg/dL  Ferritin   Collection Time: 02/11/20  7:57 AM  Result Value Ref Range   Ferritin 90 11 - 307 ng/mL  Fibrinogen   Collection Time: 02/11/20  7:57 AM  Result Value Ref Range   Fibrinogen 417 210 - 475 mg/dL  Hepatitis B surface antigen   Collection Time: 02/11/20  7:57 AM  Result Value Ref Range   Hepatitis B  Surface Ag NON REACTIVE NON REACTIVE  Lactate dehydrogenase   Collection Time: 02/11/20  7:57 AM  Result Value Ref Range   LDH 258 (H) 98 - 192 U/L  Procalcitonin   Collection Time: 02/11/20  7:57 AM  Result Value Ref Range   Procalcitonin 0.42 ng/mL  Lipase, blood   Collection Time: 02/11/20  7:57 AM  Result Value Ref Range   Lipase 51 11 - 51 U/L  Troponin I (High Sensitivity)   Collection Time: 02/11/20  7:57 AM  Result Value Ref Range   Troponin I (High Sensitivity) 4 <18 ng/L  Culture, blood (Routine X 2) w Reflex to ID Panel   Collection Time: 02/11/20  8:04 AM   Specimen: BLOOD  Result Value Ref Range   Specimen Description BLOOD RIGHT ANTECUBITAL    Special Requests      BOTTLES DRAWN AEROBIC AND ANAEROBIC Blood Culture adequate volume   Culture      NO GROWTH 4 DAYS Performed at [facility], [address] [place], [place] [id number]    Report Status PENDING   Culture, blood (Routine X 2) w Reflex to ID Panel   Collection Time: 02/11/20  8:04 AM   Specimen: BLOOD  Result Value Ref Range   Specimen Description BLOOD BLOOD LEFT HAND    Special Requests      BOTTLES DRAWN AEROBIC AND ANAEROBIC Blood Culture adequate volume   Culture      NO GROWTH 4 DAYS Performed at [facility], [address] [place], [place] [address]    Report Status PENDING    CT Angio Chest PE W and/or Wo Contrast  Result Date: 02/11/2020 CLINICAL DATA:  Shortness of breath.  COVID-19 positive. EXAM: CT ANGIOGRAPHY CHEST WITH CONTRAST TECHNIQUE: Multidetector CT imaging of the chest was performed using the standard protocol during bolus administration of intravenous contrast. Multiplanar CT image reconstructions and MIPs were obtained to evaluate the vascular anatomy. CONTRAST:  75mL OMNIPAQUE IOHEXOL 350 MG/ML SOLN COMPARISON:  Chest radiograph February 11, 2020 FINDINGS: Cardiovascular: There is no demonstrable pulmonary embolus. There is no thoracic aortic aneurysm or  dissection. Visualized great vessels appear normal. No evident pericardial effusion or pericardial thickening. Mediastinum/Nodes: Visualized thyroid appears normal. No evident adenopathy. There is a small hiatal hernia. Lungs/Pleura: There is widespread airspace opacity throughout the lungs bilaterally involving virtually all lobes in segments to varying degrees. No cavitary areas evident. No pleural effusions. Upper Abdomen: Visualized upper abdominal structures appear unremarkable. Musculoskeletal: No blastic or lytic bone lesions. No evident chest wall lesions. Review of the MIP images confirms the above findings. IMPRESSION: 1. No demonstrable pulmonary embolus. No thoracic aortic aneurysm or dissection. 2. Widespread airspace opacity consistent with multifocal pneumonia. Appearance consistent with known COVID 19 positive status. 3.  No evident adenopathy. 4.  Small hiatal hernia. Electronically Signed   By: Bretta Bang III M.D.   On: 02/11/2020 10:49   US OB Limited  Result Date: 02/11/2020 CLINICAL DATA:  Decreased fetal movement EXAM: LIMITED OBSTETRIC ULTRASOUND FINDINGS: Number of Fetuses: 1 Heart Rate:  150 bpm Movement: Yes Presentation: Breech Placental Location: Posterior Previa: No Amniotic Fluid (Subjective):  Within normal limits. AFI: 19.2 cm BPD: 7.2 cm 29 w  0 d MATERNAL FINDINGS: Cervix:  Appears closed.  Length: 3.9 cm. Uterus/Adnexae: No abnormality visualized. IMPRESSION: 1. Single live intrauterine gestation in breech presentation. 2. Active fetal heart tones at 150 bpm. 3. Amniotic fluid index of 19.2 cm, within normal limits. This exam is performed on an emergent basis and does not comprehensively evaluate fetal size, dating, or anatomy; follow-up complete OB US should be considered if further fetal assessment is warranted. Electronically Signed   By: Duanne Guess D.O.   On: 02/11/2020 08:12   DG Chest Portable 1 View  Result Date: 02/11/2020 CLINICAL DATA:  COVID  positive.  Cough. EXAM: PORTABLE CHEST 1 VIEW COMPARISON:  None. FINDINGS: Patchy bilateral pneumonia. Negative for air leak. Low volume chest. Normal heart size and mediastinal contours for technique. IMPRESSION: Multifocal pneumonia. Electronically Signed   By: Marnee Spring M.D.   On: 02/11/2020 07:43    No future appointments.  Discharge Condition: Stable  Discharge disposition: 01-Home or Self Care        Allergies as of 02/15/2020      Reactions   Condoms - Female [nonoxynol 9] Other (See Comments)   Reaction:  Unknown      Medication List    STOP taking these medications   Augmentin 875-125 MG tablet Generic drug: amoxicillin-clavulanate     TAKE these medications   acetaminophen 500 MG tablet Commonly known as: TYLENOL Take 500-1,000 mg by mouth every 6 (six) hours as needed for mild pain or fever.   albuterol 108 (90 Base) MCG/ACT inhaler Commonly known as: VENTOLIN HFA Inhale 2 puffs into the lungs every 4 (four) hours as needed for wheezing or shortness of breath.   Dextromethorphan-guaiFENesin 10-100 MG/5ML liquid Take 10 mLs by mouth every 6 (six) hours as  needed for cough.   metFORMIN 1000 MG tablet Commonly known as: GLUCOPHAGE Take 1 tablet (1,000 mg total) by mouth 2 (two) times daily with a meal.   predniSONE 10 MG tablet Commonly known as: DELTASONE 5 tablets QD on 10/11 THEN 2 tablets QD on 10/12 and 10/13 THEN 1 tablet QD on 10/14 and 10/15 THEN STOP        Total discharge time: 30 minutes   Signed: Jaynie Collins M.D. 02/15/2020, 4:14 PM

## 2020-02-15 NOTE — Progress Notes (Signed)
Patient ID: [name] Kidd, female   DOB: 01/04/1991, 29 y.o.   MRN: [id number] FACULTY PRACTICE ANTEPARTUM PROGRESS NOTE  [name] Kidd is a 29 y.o. G2P1001 at [redacted]w[redacted]d who is admitted for treatment of covid -19 pneumonia.  Estimated Date of Delivery: 04/17/20 Fetal presentation is unsure.  Length of Stay:  4 Days. Admitted 02/11/2020  Subjective: Pt states  that overall she is feeling better, no breathing problems. Patient reports normal fetal movement.  She denies uterine contractions, denies bleeding and leaking of fluid per vagina.  Vitals:  Blood pressure 108/70, pulse 89, temperature 98 F (36.7 C), temperature source Oral, resp. rate 18, height 5\' 4"  (1.626 m), weight 83.9 kg, last menstrual period 07/26/2019, SpO2 93 %, unknown if currently breastfeeding. Physical Examination: CONSTITUTIONAL: Well-developed, well-nourished female in no acute distress.  NEUROLGIC: Alert and oriented to person, place, and time. Normal reflexes, muscle tone coordination. No cranial nerve deficit noted. CARDIOVASCULAR: Normal heart rate noted, regular rhythm RESPIRATORY: Effort and breath sounds normal, no problems with respiration noted, non productive cough noted MUSCULOSKELETAL: Normal range of motion. No edema and no tenderness. ABDOMEN: Soft, nontender, nondistended, gravid. CERVIX: deferred  Fetal monitoring: Baseline 140, mod variability, +accels, no decels No contractions  Results for orders placed or performed during the hospital encounter of 02/11/20 (from the past 48 hour(s))  Glucose, capillary     Status: Abnormal   Collection Time: 02/13/20  3:06 PM  Result Value Ref Range   Glucose-Capillary 127 (H) 70 - 99 mg/dL    Comment: Glucose reference range applies only to samples taken after fasting for at least 8 hours.  Glucose, capillary     Status: Abnormal   Collection Time: 02/13/20  9:29 PM  Result Value Ref Range   Glucose-Capillary 122 (H) 70 - 99  mg/dL    Comment: Glucose reference range applies only to samples taken after fasting for at least 8 hours.  Comprehensive metabolic panel     Status: Abnormal   Collection Time: 02/14/20  5:02 AM  Result Value Ref Range   Sodium 138 135 - 145 mmol/L   Potassium 3.7 3.5 - 5.1 mmol/L   Chloride 112 (H) 98 - 111 mmol/L   CO2 14 (L) 22 - 32 mmol/L   Glucose, Bld 114 (H) 70 - 99 mg/dL    Comment: Glucose reference range applies only to samples taken after fasting for at least 8 hours.   BUN 9 6 - 20 mg/dL   Creatinine, Ser 04/15/20 0.44 - 1.00 mg/dL   Calcium 8.4 (L) 8.9 - 10.3 mg/dL   Total Protein 6.0 (L) 6.5 - 8.1 g/dL   Albumin 2.1 (L) 3.5 - 5.0 g/dL   AST 2.99 (H) 15 - 41 U/L   ALT 131 (H) 0 - 44 U/L   Alkaline Phosphatase 93 38 - 126 U/L   Total Bilirubin 0.2 (L) 0.3 - 1.2 mg/dL   GFR, Estimated 242 >68 mL/min   Anion gap 12 5 - 15    Comment: Performed at [facility], [address], [place], [place]  C-reactive protein     Status: Abnormal   Collection Time: 02/14/20  5:02 AM  Result Value Ref Range   CRP 1.3 (H) <1.0 mg/dL    Comment: Performed at [facility], [address], [place], [place]  CBC with Differential/Platelet     Status: Abnormal   Collection Time: 02/14/20  5:02 AM  Result Value Ref  Range   WBC 5.2 4.0 - 10.5 K/uL   RBC 4.52 3.87 - 5.11 MIL/uL   Hemoglobin 12.2 12.0 - 15.0 g/dL   HCT 83.6 36 - 46 %   MCV 84.1 80.0 - 100.0 fL   MCH 27.0 26.0 - 34.0 pg   MCHC 32.1 30.0 - 36.0 g/dL   RDW 62.9 (H) 47.6 - 54.6 %   Platelets 289 150 - 400 K/uL   nRBC 1.3 (H) 0.0 - 0.2 %   Neutrophils Relative % 62 %   Neutro Abs 3.3 1.7 - 7.7 K/uL   Lymphocytes Relative 31 %   Lymphs Abs 1.6 0.7 - 4.0 K/uL   Monocytes Relative 4 %   Monocytes Absolute 0.2 0.1 - 1.0 K/uL   Eosinophils Relative 0 %   Eosinophils Absolute 0.0 0 - 0 K/uL   Basophils Relative 0 %   Basophils Absolute 0.0 0 - 0 K/uL   Immature Granulocytes 3 %   Abs Immature  Granulocytes 0.13 (H) 0.00 - 0.07 K/uL   Smudge Cells PRESENT     Comment: Performed at [facility], [address], [place], [place] [address]  D-dimer, quantitative (not at [facility])     Status: Abnormal   Collection Time: 02/14/20  5:02 AM  Result Value Ref Range   D-Dimer, Quant 0.87 (H) 0.00 - 0.50 ug/mL-FEU    Comment: (NOTE) At the manufacturer cut-off value of 0.5 g/mL FEU, this assay has a negative predictive value of 95-100%.This assay is intended for use in conjunction with a clinical pretest probability (PTP) assessment model to exclude pulmonary embolism (PE) and deep venous thrombosis (DVT) in outpatients suspected of PE or DVT. Results should be correlated with clinical presentation. Performed at [facility], [address] [place], [place] [address]   Magnesium     Status: None   Collection Time: 02/14/20  5:02 AM  Result Value Ref Range   Magnesium 1.8 1.7 - 2.4 mg/dL    Comment: Performed at [facility], [address] [place], [place] [address]  Ferritin     Status: None   Collection Time: 02/14/20  5:02 AM  Result Value Ref Range   Ferritin 71 11 - 307 ng/mL    Comment: Performed at [facility], [address] [place], [place] [address]  Glucose, capillary     Status: Abnormal   Collection Time: 02/14/20  5:57 AM  Result Value Ref Range   Glucose-Capillary 104 (H) 70 - 99 mg/dL    Comment: Glucose reference range applies only to samples taken after fasting for at least 8 hours.  Glucose, capillary     Status: Abnormal   Collection Time: 02/14/20  1:38 PM  Result Value Ref Range   Glucose-Capillary 155 (H) 70 - 99 mg/dL    Comment: Glucose reference range applies only to samples taken after fasting for at least 8 hours.  Glucose, capillary     Status: Abnormal   Collection Time: 02/14/20  5:00 PM  Result Value Ref Range   Glucose-Capillary 101 (H) 70 - 99 mg/dL    Comment: Glucose reference range applies only to samples taken  after fasting for at least 8 hours.  Glucose, capillary     Status: Abnormal   Collection Time: 02/14/20  9:06 PM  Result Value Ref Range   Glucose-Capillary 136 (H) 70 - 99 mg/dL    Comment: Glucose reference range applies only to samples taken after fasting for  at least 8 hours.  Comprehensive metabolic panel     Status: Abnormal   Collection Time: 02/15/20  6:52 AM  Result Value Ref Range   Sodium 137 135 - 145 mmol/L   Potassium 3.5 3.5 - 5.1 mmol/L   Chloride 108 98 - 111 mmol/L   CO2 15 (L) 22 - 32 mmol/L   Glucose, Bld 112 (H) 70 - 99 mg/dL    Comment: Glucose reference range applies only to samples taken after fasting for at least 8 hours.   BUN 9 6 - 20 mg/dL   Creatinine, Ser 2.35 0.44 - 1.00 mg/dL   Calcium 8.5 (L) 8.9 - 10.3 mg/dL   Total Protein 6.1 (L) 6.5 - 8.1 g/dL   Albumin 2.1 (L) 3.5 - 5.0 g/dL   AST 361 (H) 15 - 41 U/L   ALT 155 (H) 0 - 44 U/L   Alkaline Phosphatase 99 38 - 126 U/L   Total Bilirubin 0.3 0.3 - 1.2 mg/dL   GFR, Estimated >44 >31 mL/min   Anion gap 14 5 - 15    Comment: Performed at [facility], [address] [place], [place] [address]  C-reactive protein     Status: None   Collection Time: 02/15/20  6:52 AM  Result Value Ref Range   CRP 0.7 <1.0 mg/dL    Comment: Performed at [facility], [address], [place], [place] [address]  CBC with Differential/Platelet     Status: Abnormal   Collection Time: 02/15/20  6:52 AM  Result Value Ref Range   WBC 5.7 4.0 - 10.5 K/uL   RBC 4.59 3.87 - 5.11 MIL/uL   Hemoglobin 12.4 12.0 - 15.0 g/dL   HCT 50.9 36 - 46 %   MCV 83.2 80.0 - 100.0 fL   MCH 27.0 26.0 - 34.0 pg   MCHC 32.5 30.0 - 36.0 g/dL   RDW 32.6 71.2 - 45.8 %   Platelets 349 150 - 400 K/uL   nRBC 1.7 (H) 0.0 - 0.2 %   Neutrophils Relative % 82 %   Neutro Abs 4.7 1.7 - 7.7 K/uL   Lymphocytes Relative 10 %   Lymphs Abs 0.6 (L) 0.7 - 4.0 K/uL   Monocytes Relative 7 %   Monocytes Absolute 0.4 0.1 - 1.0 K/uL   Eosinophils  Relative 0 %   Eosinophils Absolute 0.0 0 - 0 K/uL   Basophils Relative 0 %   Basophils Absolute 0.0 0 - 0 K/uL   nRBC 2 (H) 0 /100 WBC   Myelocytes 1 %   Abs Immature Granulocytes 0.10 (H) 0.00 - 0.07 K/uL   Polychromasia PRESENT     Comment: Performed at [facility], [address] [place], [place] [address]  D-dimer, quantitative (not at [facility])     Status: Abnormal   Collection Time: 02/15/20  6:52 AM  Result Value Ref Range   D-Dimer, Quant 0.85 (H) 0.00 - 0.50 ug/mL-FEU    Comment: (NOTE) At the manufacturer cut-off value of 0.5 g/mL FEU, this assay has a negative predictive value of 95-100%.This assay is intended for use in conjunction with a clinical pretest probability (PTP) assessment model to exclude pulmonary embolism (PE) and deep venous thrombosis (DVT) in outpatients suspected of PE or DVT. Results should be correlated with clinical presentation. Performed at [facility], [address] [place], [place] [address]   Magnesium     Status: None   Collection Time: 02/15/20  6:52 AM  Result Value Ref Range   Magnesium 1.7 1.7 - 2.4 mg/dL    Comment: Performed at [facility], [address] [place], [place] [address]  Ferritin     Status: None   Collection Time: 02/15/20  6:52 AM  Result Value Ref Range   Ferritin 39 11 - 307 ng/mL    Comment: Performed at [facility], [address] [place], [place] [address]  Glucose, capillary     Status: Abnormal   Collection Time: 02/15/20  8:13 AM  Result Value Ref Range   Glucose-Capillary 105 (H) 70 - 99 mg/dL    Comment: Glucose reference range applies only to samples taken after fasting for at least 8 hours.  Glucose, capillary     Status: Abnormal   Collection Time: 02/15/20 10:40 AM  Result Value Ref Range   Glucose-Capillary 118 (H) 70 - 99 mg/dL    Comment: Glucose reference range applies only to samples taken after fasting for at least 8 hours.    I have reviewed the patient's current  medications.  ASSESSMENT: Active Problems:   Pneumonia due to COVID-19 virus   Hypothyroidism   [redacted] weeks gestation of pregnancy   Diet controlled gestational diabetes mellitus (GDM) in third trimester   PLAN: - Covid 19 pneumonia:  Continue inpatient management per medicine team.  Last dose of Remdisivir given today, disposition [facility]  - 31 week pregnancy:  Continue daily NST. Category I.  - Gestational diabetes:   CBGs within range, continue Metformin to 1000 mg BID    Continue routine antenatal care.    Jaynie CollinsUgonna Yoselyn Mcglade, MD Faculty Attending, [facility] 02/15/2020 11:30 AM

## 2020-02-15 NOTE — Progress Notes (Signed)
Phoned Spanish interpreter. Reviewed discharge instructions with patient regarding medications and when to call MD/go to MAU. Pt to call OB for follow up prenatal appointments until delivery. Reviewed quarantine instructions with patient. Pt verbalized understanding.

## 2020-02-16 LAB — CULTURE, BLOOD (ROUTINE X 2)
Culture: NO GROWTH
Culture: NO GROWTH
Special Requests: ADEQUATE
Special Requests: ADEQUATE

## 2020-06-10 ENCOUNTER — Emergency Department: Payer: HRSA Program

## 2020-06-10 ENCOUNTER — Encounter: Payer: Self-pay | Admitting: Emergency Medicine

## 2020-06-10 ENCOUNTER — Other Ambulatory Visit: Payer: Self-pay

## 2020-06-10 ENCOUNTER — Emergency Department
Admission: EM | Admit: 2020-06-10 | Discharge: 2020-06-10 | Disposition: A | Discharge status: Home / Self Care | Payer: HRSA Program | Attending: Emergency Medicine | Admitting: Emergency Medicine

## 2020-06-10 DIAGNOSIS — M549 Dorsalgia, unspecified: Secondary | ICD-10-CM | POA: Diagnosis present

## 2020-06-10 DIAGNOSIS — E039 Hypothyroidism, unspecified: Secondary | ICD-10-CM | POA: Insufficient documentation

## 2020-06-10 DIAGNOSIS — Z87891 Personal history of nicotine dependence: Secondary | ICD-10-CM | POA: Insufficient documentation

## 2020-06-10 DIAGNOSIS — R109 Unspecified abdominal pain: Secondary | ICD-10-CM | POA: Diagnosis present

## 2020-06-10 DIAGNOSIS — R112 Nausea with vomiting, unspecified: Secondary | ICD-10-CM | POA: Diagnosis present

## 2020-06-10 DIAGNOSIS — Z3201 Encounter for pregnancy test, result positive: Secondary | ICD-10-CM | POA: Insufficient documentation

## 2020-06-10 DIAGNOSIS — U071 COVID-19: Secondary | ICD-10-CM | POA: Diagnosis not present

## 2020-06-10 DIAGNOSIS — Z8616 Personal history of COVID-19: Secondary | ICD-10-CM | POA: Diagnosis not present

## 2020-06-10 DIAGNOSIS — R509 Fever, unspecified: Secondary | ICD-10-CM

## 2020-06-10 LAB — URINALYSIS, COMPLETE (UACMP) WITH MICROSCOPIC
Glucose, UA: NEGATIVE mg/dL
Nitrite: NEGATIVE
Protein, ur: >300 mg/dL — AB
Specific Gravity, Urine: 1.025 (ref 1.005–1.030)
pH: 5.5 (ref 5.0–8.0)

## 2020-06-10 LAB — COMPREHENSIVE METABOLIC PANEL
ALT: 66 U/L — ABNORMAL HIGH (ref 0–44)
AST: 50 U/L — ABNORMAL HIGH (ref 15–41)
Albumin: 3.5 g/dL (ref 3.5–5.0)
Alkaline Phosphatase: 87 U/L (ref 38–126)
Anion gap: 10 (ref 5–15)
BUN: 9 mg/dL (ref 6–20)
CO2: 20 mmol/L — ABNORMAL LOW (ref 22–32)
Calcium: 8.3 mg/dL — ABNORMAL LOW (ref 8.9–10.3)
Chloride: 100 mmol/L (ref 98–111)
Creatinine, Ser: 0.86 mg/dL (ref 0.44–1.00)
GFR, Estimated: >60 mL/min (ref >60)
Glucose, Bld: 183 mg/dL — ABNORMAL HIGH (ref 70–99)
Potassium: 2.7 mmol/L — CL (ref 3.5–5.1)
Sodium: 130 mmol/L — ABNORMAL LOW (ref 135–145)
Total Bilirubin: 0.7 mg/dL (ref 0.3–1.2)
Total Protein: 7.7 g/dL (ref 6.5–8.1)

## 2020-06-10 LAB — LACTIC ACID, PLASMA: Lactic Acid, Venous: 1 mmol/L (ref 0.5–1.9)

## 2020-06-10 LAB — CBC
HCT: 33.5 % — ABNORMAL LOW (ref 36.0–46.0)
Hemoglobin: 11.3 g/dL — ABNORMAL LOW (ref 12.0–15.0)
MCH: 27.8 pg (ref 26.0–34.0)
MCHC: 33.7 g/dL (ref 30.0–36.0)
MCV: 82.3 fL (ref 80.0–100.0)
Platelets: 201 10*3/uL (ref 150–400)
RBC: 4.07 MIL/uL (ref 3.87–5.11)
RDW: 14 % (ref 11.5–15.5)
WBC: 15.7 10*3/uL — ABNORMAL HIGH (ref 4.0–10.5)
nRBC: 0 % (ref 0.0–0.2)

## 2020-06-10 LAB — SARS CORONAVIRUS 2 BY RT PCR (HOSPITAL ORDER, PERFORMED IN ~~LOC~~ HOSPITAL LAB): SARS Coronavirus 2: POSITIVE — AB

## 2020-06-10 LAB — POC URINE PREG, ED: Preg Test, Ur: POSITIVE — AB

## 2020-06-10 LAB — POC SARS CORONAVIRUS 2 AG -  ED: SARS Coronavirus 2 Ag: NEGATIVE

## 2020-06-10 LAB — HCG, QUANTITATIVE, PREGNANCY: hCG, Beta Chain, Quant, S: 4 m[IU]/mL (ref <5)

## 2020-06-10 LAB — LIPASE, BLOOD: Lipase: 27 U/L (ref 11–51)

## 2020-06-10 MED ORDER — LACTATED RINGERS IV BOLUS
1000.0000 mL | Freq: Once | INTRAVENOUS | Status: AC
  Administered 2020-06-10: 1000 mL via INTRAVENOUS

## 2020-06-10 MED ORDER — ACETAMINOPHEN 325 MG PO TABS
650.0000 mg | ORAL_TABLET | Freq: Once | ORAL | Status: AC | PRN
  Administered 2020-06-10: 650 mg via ORAL

## 2020-06-10 MED ORDER — ACETAMINOPHEN 325 MG PO TABS
ORAL_TABLET | ORAL | Status: AC
  Filled 2020-06-10: qty 2

## 2020-06-10 MED ORDER — ONDANSETRON 4 MG PO TBDP: 4.0000 mg | ORAL_TABLET | Freq: Three times a day (TID) | ORAL | 0 refills | Status: AC | PRN

## 2020-06-10 MED ORDER — ACETAMINOPHEN 325 MG PO TABS
650.0000 mg | ORAL_TABLET | Freq: Once | ORAL | Status: AC
  Administered 2020-06-10: 650 mg via ORAL
  Filled 2020-06-10: qty 2

## 2020-06-10 MED ORDER — POTASSIUM CHLORIDE CRYS ER 20 MEQ PO TBCR
40.0000 meq | EXTENDED_RELEASE_TABLET | Freq: Once | ORAL | Status: AC
  Administered 2020-06-10: 40 meq via ORAL
  Filled 2020-06-10: qty 2

## 2020-06-10 MED ORDER — POTASSIUM CHLORIDE 10 MEQ/100ML IV SOLN
10.0000 meq | INTRAVENOUS | Status: AC
  Administered 2020-06-10 (×3): 10 meq via INTRAVENOUS
  Filled 2020-06-10 (×3): qty 100

## 2020-06-10 NOTE — ED Notes (Signed)
ED Provider at bedside. 

## 2020-06-10 NOTE — ED Notes (Signed)
ED Provider at bedside with update on plan of care. Pt provided with snacks and drink. Per MD okay at this time.
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
## 2020-06-10 NOTE — ED Notes (Signed)
Spanish interpreter contacted for patient assessment.

## 2020-06-10 NOTE — ED Triage Notes (Signed)
Pt comes into the ED via POV c/o back pain, fever, and emesis.  Pt got second dose of COVID vaccine on Friday.  Pt also states she has a headache, and it gets worse when she bends over.  Pt currently has even and unlabored respirations at this time.  Pt denies any CP or SHOB.  Pt states the symptoms have been ongoing for 2 days.

## 2020-06-10 NOTE — Discharge Instructions (Addendum)
Your fever is most likely secondary to COVID.  Your urine culture is pending and you need to follow-up in MyChart if it grows anything.  You should take Tylenol 650 mg every 6 hours to help with your fevers I prescribed some nausea medicine.  Drink plenty of fluids to stay well-hydrated.  There is questionable you may be being pregnant again although your hormone level was very low.  He should have this repeated with your primary care doctor within a week

## 2020-06-10 NOTE — ED Notes (Signed)
Bedside ultrasound complete at this time.

## 2020-06-10 NOTE — ED Notes (Signed)
Spanish interpreter contacted at this time. ID #[id number]

## 2020-06-11 ENCOUNTER — Telehealth: Payer: Self-pay | Admitting: Oncology

## 2020-06-11 ENCOUNTER — Encounter: Payer: Self-pay | Admitting: Oncology

## 2020-06-11 NOTE — Telephone Encounter (Signed)
Called to discuss with patient about COVID-19 symptoms and the use of one of the available treatments for those with mild to moderate Covid symptoms and at a high risk of hospitalization.  Pt appears to qualify for outpatient treatment due to co-morbid conditions and/or a member of an at-risk group in accordance with the FDA Emergency Use Authorization.    Symptom onset: 06/08/20 Vaccinated: Yes Booster? unsure Immunocompromised? no Qualifiers:  Past Medical History:  Diagnosis Date  . Anemia    2015 pregnancy  . Dental caries   . Depression 2006   History of suicide attempt  . Gestational diabetes    2016-took Glyburide-up to 3.75mg  bid  . History of positive PPD    Per record-completed treatment 05/29/2008  . Hypothyroidism    Hx subclinical hypothyroidism  . Pneumonia due to COVID-19 virus 02/11/2020  . PPD positive, treated 2010   Negative by CXR  . Vaginal Pap smear, abnormal    2015-[facility]     Unable to reach pt - Left VM with interpreter  Mauro Kaufmann

## 2020-06-13 LAB — URINE CULTURE

## 2020-06-15 LAB — CULTURE, BLOOD (ROUTINE X 2)
Culture: NO GROWTH
Culture: NO GROWTH
Special Requests: ADEQUATE
Special Requests: ADEQUATE

## 2020-11-17 DIAGNOSIS — Z131 Encounter for screening for diabetes mellitus: Secondary | ICD-10-CM

## 2020-11-17 LAB — GLUCOSE, POCT (MANUAL RESULT ENTRY): POC Glucose: 124 mg/dl — AB (ref 70–99)

## 2020-11-17 NOTE — Congregational Nurse Program (Signed)
  Dept: [phone]   Congregational Nurse Program Note  Date of Encounter: 11/17/2020  Past Medical History: Past Medical History:  Diagnosis Date   Anemia    2015 pregnancy   Dental caries    Depression 2006   History of suicide attempt   Gestational diabetes    2016-took Glyburide-up to 3.75mg  bid   History of positive PPD    Per record-completed treatment 05/29/2008   Hypothyroidism    Hx subclinical hypothyroidism   Pneumonia due to COVID-19 virus 02/11/2020   PPD positive, treated 2010   Negative by CXR   Vaginal Pap smear, abnormal    2015-[facility]    Encounter Details:  CNP Questionnaire - 11/17/20 2350       Questionnaire   Do you give verbal consent to treat you today? Yes    Visit Setting Church or Engineer, technical sales Patient Served At [employer], [employer]    Patient Status Not Applicable    Medical Provider Yes    Insurance Uninsured (Includes [employer]/[facility] [place])    Intervention Assess (including screenings);Refer;Support;Counsel    Food Have food insecurities    Screening Referrals Annual Wellness Visit           [facility]. Agrees to screening. Co. Re: [employer]. Reviewed pre-diabetes screening form presented today and is concerned since both her parents are diabetic and she had gestational diabetes with both pregnancies and hasn't started exercising after delivering daughter in Dec 2021. Non fasting (just ate lunch) glucose 124. Reviewed basic prevention methods for pre diabetes. Encouraged regular exercise and watching serving sizes of carb foods. Had baby love Medicaid in pregnancy but no insurance now. Referred to DSS to apply for family planning medicaid. Enrolled in [facility] cinic. To RTC prn. Rhermann, Rn

## 2021-04-17 IMAGING — CT CT ANGIO CHEST
2 of 6 series · 18 of 46 positions shown · IV contrast (APPLIED)
Comparison: Chest radiograph February 11, 2020

CLINICAL DATA: Shortness of breath.  [id number] positive.

EXAM:
CT ANGIOGRAPHY CHEST WITH CONTRAST
TECHNIQUE: Multidetector CT imaging of the chest was performed using the
standard protocol during bolus administration of intravenous
contrast. Multiplanar CT image reconstructions and MIPs were
obtained to evaluate the vascular anatomy.
CONTRAST:  75mL OMNIPAQUE IOHEXOL 350 MG/ML SOLN

[Series 5: thins · axial · 0.67mm/px · z∈[-287,-95]mm · 15 of 212 slices shown]
[im 10/212  lung]
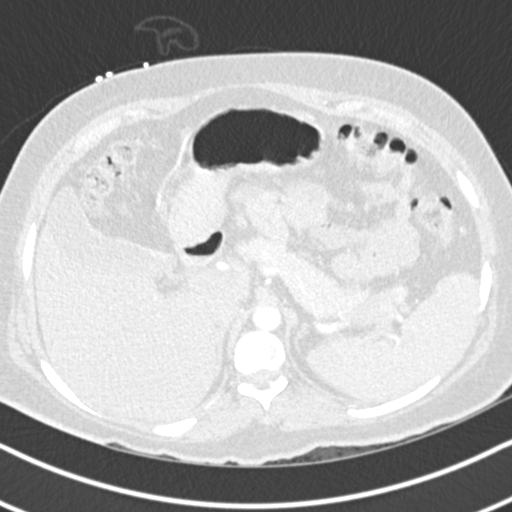
[im 28/212  soft-tissue]
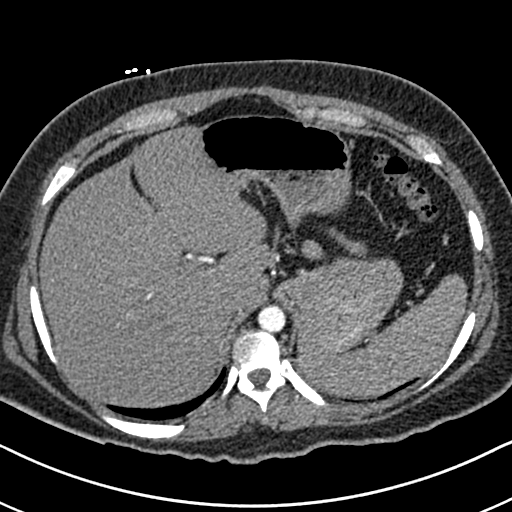
[im 37/212  lung]
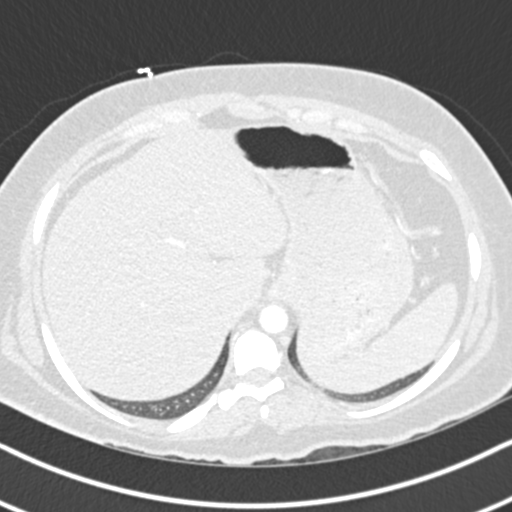
[im 56/212  soft-tissue]
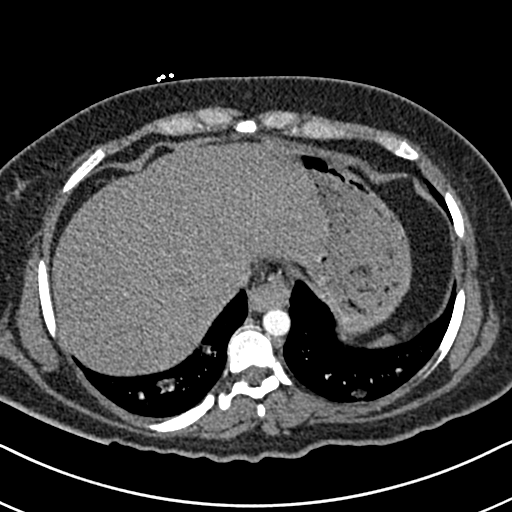
[im 65/212  lung]
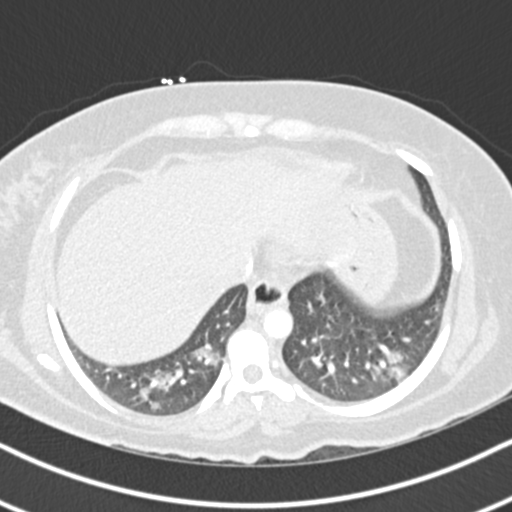
[im 83/212  soft-tissue]
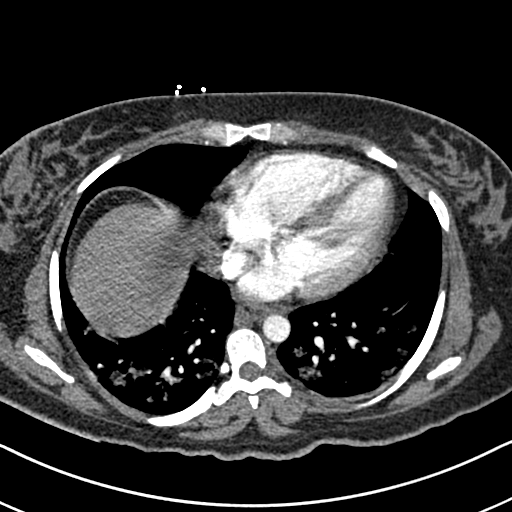
[im 92/212  lung]
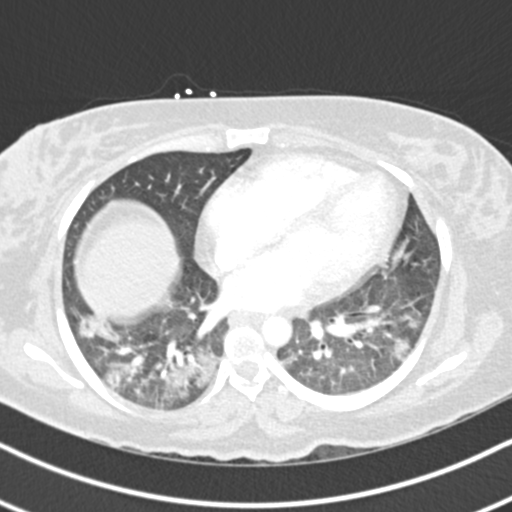
[im 111/212  soft-tissue]
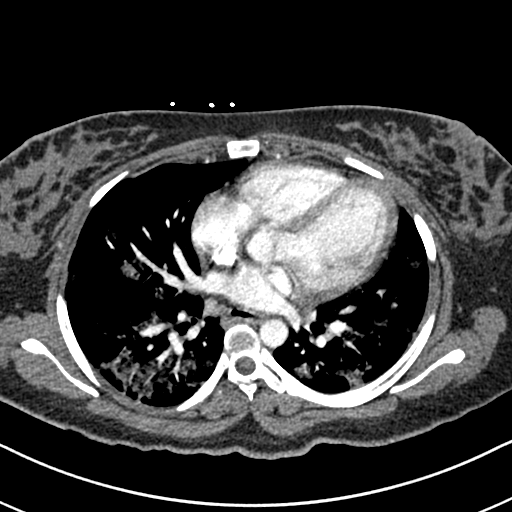
[im 120/212  lung]
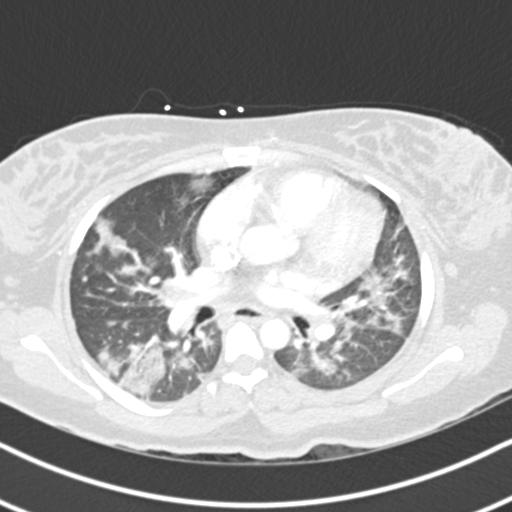
[im 129/212  soft-tissue]
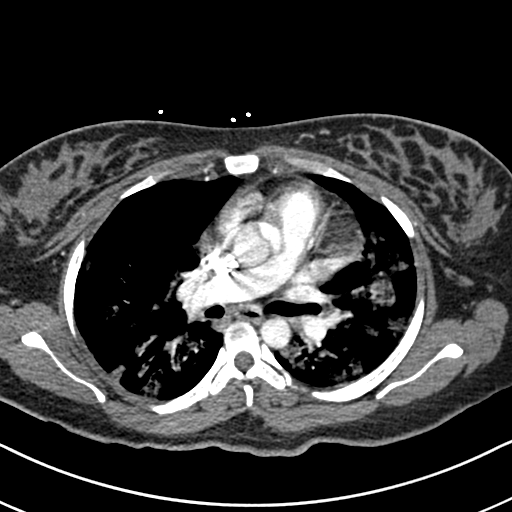
[im 147/212  lung]
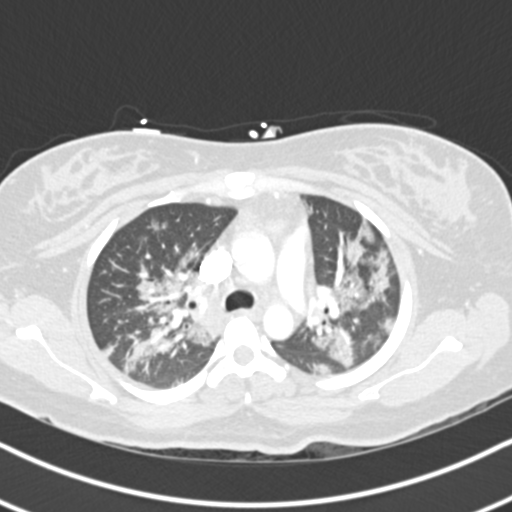
[im 156/212  soft-tissue]
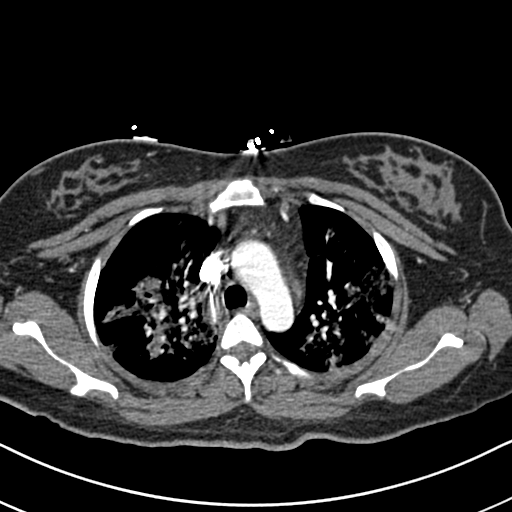
[im 175/212  lung]
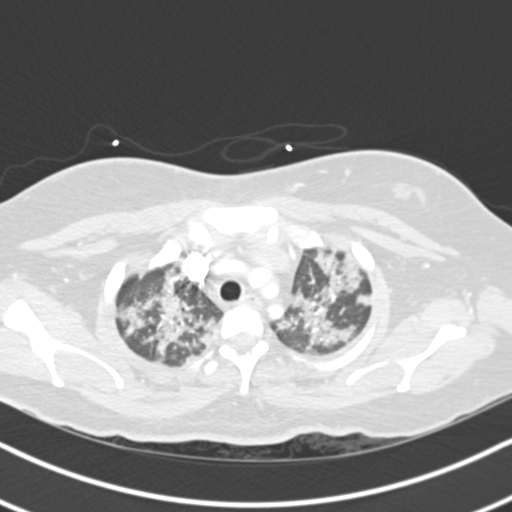
[im 184/212  soft-tissue]
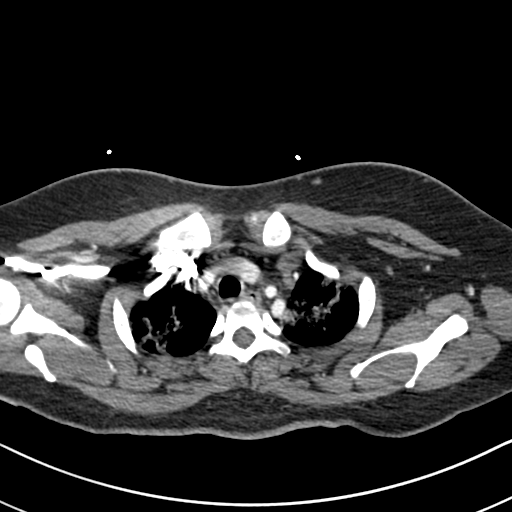
[im 202/212  lung]
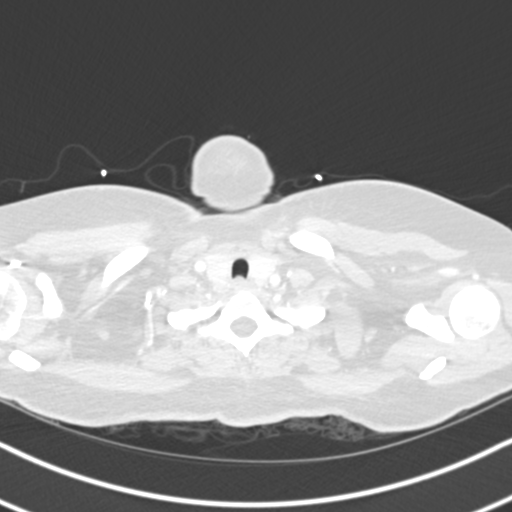

[Series 7: coronal mpr · coronal · 0.43mm/px · 3 of 86 slices shown]
[im 22/86  soft-tissue]
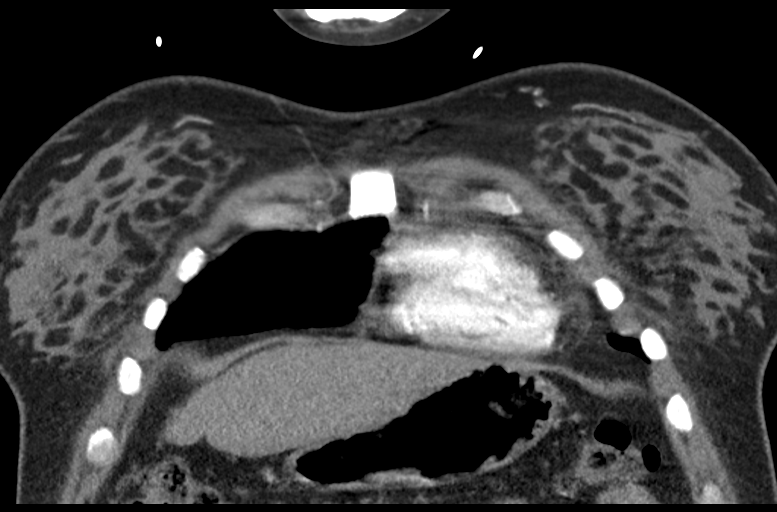
[im 43/86  soft-tissue]
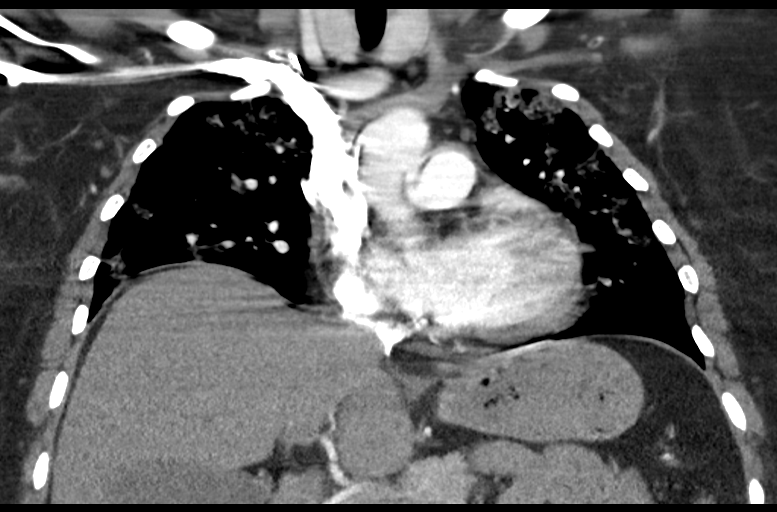
[im 64/86  soft-tissue]
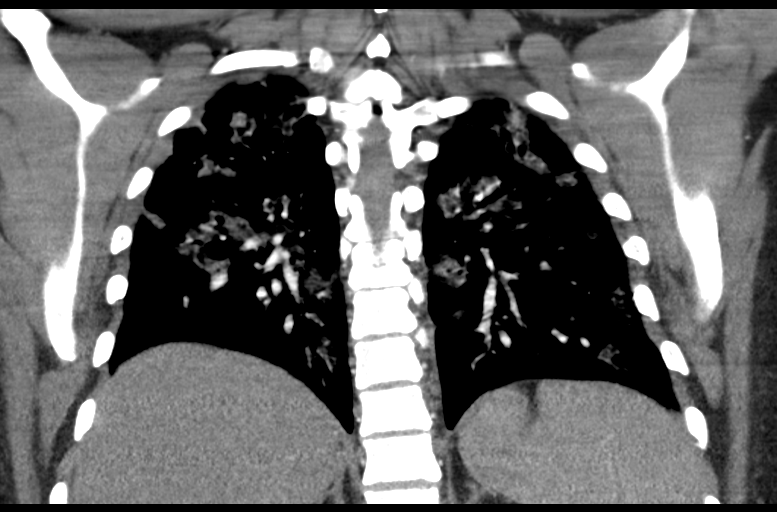

[18 of 46 positions shown; findings below may reference images not displayed]

FINDINGS: Cardiovascular: There is no demonstrable pulmonary embolus. There is
no thoracic aortic aneurysm or dissection. Visualized great vessels
appear normal. No evident pericardial effusion or pericardial
thickening.

Mediastinum/Nodes: Visualized thyroid appears normal. No evident
adenopathy. There is a small hiatal hernia.

Lungs/Pleura: There is widespread airspace opacity throughout the
lungs bilaterally involving virtually all lobes in segments to
varying degrees. No cavitary areas evident. No pleural effusions.

Upper Abdomen: Visualized upper abdominal structures appear
unremarkable.

Musculoskeletal: No blastic or lytic bone lesions. No evident chest
wall lesions.

Review of the MIP images confirms the above findings.
IMPRESSION: 1. No demonstrable pulmonary embolus. No thoracic aortic aneurysm or
dissection.

2. Widespread airspace opacity consistent with multifocal pneumonia.
Appearance consistent with known COVID 19 positive status.

3.  No evident adenopathy.

4.  Small hiatal hernia.
